# Patient Record
Sex: Male | Born: 1965
Health system: Southern US, Community
[De-identification: ages and names within clinical notes are randomized; demographics above are authoritative.]

## PROBLEM LIST (undated history)

## (undated) DIAGNOSIS — E785 Hyperlipidemia, unspecified: Secondary | ICD-10-CM

## (undated) DIAGNOSIS — I10 Essential (primary) hypertension: Secondary | ICD-10-CM

## (undated) DIAGNOSIS — Z87442 Personal history of urinary calculi: Secondary | ICD-10-CM

## (undated) DIAGNOSIS — Z91199 Patient's noncompliance with other medical treatment and regimen due to unspecified reason: Secondary | ICD-10-CM

## (undated) DIAGNOSIS — E119 Type 2 diabetes mellitus without complications: Secondary | ICD-10-CM

## (undated) DIAGNOSIS — E291 Testicular hypofunction: Secondary | ICD-10-CM

## (undated) DIAGNOSIS — N529 Male erectile dysfunction, unspecified: Secondary | ICD-10-CM

## (undated) DIAGNOSIS — C801 Malignant (primary) neoplasm, unspecified: Secondary | ICD-10-CM

## (undated) HISTORY — DX: Hyperlipidemia, unspecified: E78.5

## (undated) HISTORY — DX: Essential (primary) hypertension: I10

## (undated) HISTORY — DX: Male erectile dysfunction, unspecified: N52.9

## (undated) HISTORY — DX: Testicular hypofunction: E29.1

## (undated) HISTORY — DX: Type 2 diabetes mellitus without complications: E11.9

---

## 2005-02-05 ENCOUNTER — Ambulatory Visit: Payer: Self-pay | Admitting: Internal Medicine

## 2007-05-28 ENCOUNTER — Ambulatory Visit: Payer: Self-pay | Admitting: Internal Medicine

## 2007-06-24 ENCOUNTER — Emergency Department (HOSPITAL_COMMUNITY): Admission: EM | Admit: 2007-06-24 | Discharge: 2007-06-24 | Payer: Self-pay | Admitting: Emergency Medicine

## 2007-07-21 ENCOUNTER — Emergency Department (HOSPITAL_COMMUNITY): Admission: EM | Admit: 2007-07-21 | Discharge: 2007-07-21 | Payer: Self-pay | Admitting: *Deleted

## 2007-11-18 ENCOUNTER — Emergency Department (HOSPITAL_COMMUNITY): Admission: EM | Admit: 2007-11-18 | Discharge: 2007-11-18 | Payer: Self-pay | Admitting: Family Medicine

## 2008-07-21 ENCOUNTER — Emergency Department (HOSPITAL_COMMUNITY): Admission: EM | Admit: 2008-07-21 | Discharge: 2008-07-21 | Payer: Self-pay | Admitting: Emergency Medicine

## 2008-11-26 LAB — HM HEPATITIS C SCREENING LAB: HM Hepatitis Screen: NEGATIVE

## 2008-12-30 ENCOUNTER — Ambulatory Visit: Payer: Self-pay | Admitting: Internal Medicine

## 2008-12-30 DIAGNOSIS — F528 Other sexual dysfunction not due to a substance or known physiological condition: Secondary | ICD-10-CM | POA: Insufficient documentation

## 2008-12-30 DIAGNOSIS — I10 Essential (primary) hypertension: Secondary | ICD-10-CM | POA: Insufficient documentation

## 2009-01-10 ENCOUNTER — Ambulatory Visit: Payer: Self-pay | Admitting: Internal Medicine

## 2009-01-10 DIAGNOSIS — E291 Testicular hypofunction: Secondary | ICD-10-CM | POA: Insufficient documentation

## 2009-01-10 LAB — CONVERTED CEMR LAB
ALT: 30 units/L (ref 0–53)
Albumin: 4.4 g/dL (ref 3.5–5.2)
Alkaline Phosphatase: 24 units/L — ABNORMAL LOW (ref 39–117)
BUN: 11 mg/dL (ref 6–23)
Bilirubin, Direct: 0.1 mg/dL (ref 0.0–0.3)
CO2: 28 meq/L (ref 19–32)
Calcium: 9.8 mg/dL (ref 8.4–10.5)
GFR calc Af Amer: 95 mL/min
Glucose, Bld: 94 mg/dL (ref 70–99)
HDL: 46.7 mg/dL (ref >39.0)
Potassium: 3.7 meq/L (ref 3.5–5.1)
Sodium: 139 meq/L (ref 135–145)
Total Protein: 7.5 g/dL (ref 6.0–8.3)
Triglycerides: 250 mg/dL (ref 0–149)

## 2009-04-05 ENCOUNTER — Telehealth: Payer: Self-pay | Admitting: Internal Medicine

## 2009-04-06 ENCOUNTER — Ambulatory Visit: Payer: Self-pay | Admitting: Internal Medicine

## 2009-04-06 DIAGNOSIS — J309 Allergic rhinitis, unspecified: Secondary | ICD-10-CM | POA: Insufficient documentation

## 2009-04-06 LAB — CONVERTED CEMR LAB
Albumin: 4.3 g/dL (ref 3.5–5.2)
Alkaline Phosphatase: 34 units/L — ABNORMAL LOW (ref 39–117)
Basophils Absolute: 0.1 10*3/uL (ref 0.0–0.1)
Bilirubin, Direct: 0.1 mg/dL (ref 0.0–0.3)
CO2: 28 meq/L (ref 19–32)
Calcium: 9.1 mg/dL (ref 8.4–10.5)
Creatinine, Ser: 1.1 mg/dL (ref 0.4–1.5)
Eosinophils Absolute: 0.1 10*3/uL (ref 0.0–0.7)
GFR calc non Af Amer: 94.99 mL/min (ref >60)
Glucose, Bld: 93 mg/dL (ref 70–99)
Lymphocytes Relative: 20.8 % (ref 12.0–46.0)
MCHC: 35 g/dL (ref 30.0–36.0)
Monocytes Relative: 8.2 % (ref 3.0–12.0)
Neutro Abs: 5.1 10*3/uL (ref 1.4–7.7)
Neutrophils Relative %: 68.9 % (ref 43.0–77.0)
Platelets: 207 10*3/uL (ref 150.0–400.0)
RDW: 12.5 % (ref 11.5–14.6)
Total Bilirubin: 1.1 mg/dL (ref 0.3–1.2)

## 2009-04-13 ENCOUNTER — Encounter: Admission: RE | Admit: 2009-04-13 | Discharge: 2009-04-13 | Payer: Self-pay | Admitting: Internal Medicine

## 2009-04-15 ENCOUNTER — Emergency Department (HOSPITAL_COMMUNITY): Admission: EM | Admit: 2009-04-15 | Discharge: 2009-04-15 | Payer: Self-pay | Admitting: Emergency Medicine

## 2010-01-30 ENCOUNTER — Telehealth (INDEPENDENT_AMBULATORY_CARE_PROVIDER_SITE_OTHER): Payer: Self-pay | Admitting: *Deleted

## 2010-12-26 NOTE — Progress Notes (Signed)
Summary: Records request from [facility]  Request for records received from [facility]. Request forwarded to [employer]. Dena Chavis  January 30, 2010 4:17 PM

## 2010-12-27 ENCOUNTER — Other Ambulatory Visit (HOSPITAL_COMMUNITY): Payer: Self-pay | Admitting: Chiropractic Medicine

## 2010-12-27 DIAGNOSIS — M5410 Radiculopathy, site unspecified: Secondary | ICD-10-CM

## 2011-01-03 ENCOUNTER — Ambulatory Visit (HOSPITAL_COMMUNITY)
Admission: RE | Admit: 2011-01-03 | Discharge: 2011-01-03 | Disposition: A | Discharge status: Home / Self Care | Payer: BC Managed Care – PPO | Source: Ambulatory Visit | Attending: Chiropractic Medicine | Admitting: Chiropractic Medicine

## 2011-01-03 DIAGNOSIS — M542 Cervicalgia: Secondary | ICD-10-CM | POA: Insufficient documentation

## 2011-01-03 DIAGNOSIS — M5020 Other cervical disc displacement, unspecified cervical region: Secondary | ICD-10-CM | POA: Insufficient documentation

## 2011-01-03 DIAGNOSIS — M5090 Cervical disc disorder, unspecified, unspecified cervical region: Secondary | ICD-10-CM | POA: Insufficient documentation

## 2011-01-03 DIAGNOSIS — M79609 Pain in unspecified limb: Secondary | ICD-10-CM | POA: Insufficient documentation

## 2011-01-03 DIAGNOSIS — M25519 Pain in unspecified shoulder: Secondary | ICD-10-CM | POA: Insufficient documentation

## 2011-01-03 DIAGNOSIS — M5410 Radiculopathy, site unspecified: Secondary | ICD-10-CM

## 2011-01-23 ENCOUNTER — Other Ambulatory Visit: Payer: Self-pay | Admitting: Internal Medicine

## 2011-01-23 ENCOUNTER — Telehealth: Payer: Self-pay | Admitting: Internal Medicine

## 2011-01-23 ENCOUNTER — Other Ambulatory Visit: Payer: BC Managed Care – PPO

## 2011-01-23 ENCOUNTER — Encounter (INDEPENDENT_AMBULATORY_CARE_PROVIDER_SITE_OTHER): Payer: Self-pay | Admitting: *Deleted

## 2011-01-23 DIAGNOSIS — Z0389 Encounter for observation for other suspected diseases and conditions ruled out: Secondary | ICD-10-CM

## 2011-01-23 DIAGNOSIS — E785 Hyperlipidemia, unspecified: Secondary | ICD-10-CM

## 2011-01-23 DIAGNOSIS — Z0000 Encounter for general adult medical examination without abnormal findings: Secondary | ICD-10-CM

## 2011-01-23 LAB — BASIC METABOLIC PANEL
Chloride: 101 mEq/L (ref 96–112)
GFR: 71.29 mL/min (ref >60.00)
Glucose, Bld: 88 mg/dL (ref 70–99)
Potassium: 4.3 mEq/L (ref 3.5–5.1)
Sodium: 140 mEq/L (ref 135–145)

## 2011-01-23 LAB — CBC WITH DIFFERENTIAL/PLATELET
Basophils Absolute: 0 10*3/uL (ref 0.0–0.1)
Eosinophils Relative: 0.6 % (ref 0.0–5.0)
HCT: 48.9 % (ref 39.0–52.0)
Hemoglobin: 16.8 g/dL (ref 13.0–17.0)
Lymphs Abs: 1.6 10*3/uL (ref 0.7–4.0)
MCV: 83 fl (ref 78.0–100.0)
Monocytes Relative: 6.6 % (ref 3.0–12.0)
Neutro Abs: 6.8 10*3/uL (ref 1.4–7.7)
RDW: 13.6 % (ref 11.5–14.6)

## 2011-01-23 LAB — HEPATIC FUNCTION PANEL
ALT: 46 U/L (ref 0–53)
AST: 27 U/L (ref 0–37)
Albumin: 4.7 g/dL (ref 3.5–5.2)

## 2011-01-23 LAB — URINALYSIS
Specific Gravity, Urine: 1.02 (ref 1.000–1.030)
Total Protein, Urine: NEGATIVE
Urine Glucose: NEGATIVE

## 2011-01-23 LAB — TSH: TSH: 1.62 u[IU]/mL (ref 0.35–5.50)

## 2011-01-23 LAB — LDL CHOLESTEROL, DIRECT: Direct LDL: 122.1 mg/dL

## 2011-01-23 LAB — PSA: PSA: 0.66 ng/mL (ref 0.10–4.00)

## 2011-01-24 ENCOUNTER — Other Ambulatory Visit: Payer: Self-pay | Admitting: Internal Medicine

## 2011-01-24 ENCOUNTER — Encounter: Payer: Self-pay | Admitting: Internal Medicine

## 2011-01-24 ENCOUNTER — Ambulatory Visit (INDEPENDENT_AMBULATORY_CARE_PROVIDER_SITE_OTHER): Payer: BC Managed Care – PPO | Admitting: Internal Medicine

## 2011-01-24 ENCOUNTER — Ambulatory Visit (INDEPENDENT_AMBULATORY_CARE_PROVIDER_SITE_OTHER)
Admission: RE | Admit: 2011-01-24 | Discharge: 2011-01-24 | Disposition: A | Discharge status: Home / Self Care | Payer: BC Managed Care – PPO | Source: Ambulatory Visit | Attending: Internal Medicine | Admitting: Internal Medicine

## 2011-01-24 DIAGNOSIS — I10 Essential (primary) hypertension: Secondary | ICD-10-CM

## 2011-02-01 NOTE — Progress Notes (Signed)
Summary: ELEVATED BP   Phone Note Call from Patient   Summary of Call: Pt called c/o  elevated bp x 1 wk. Has had slight cp, blurred vision and fatigue off and on. BP today 153/101, reading yesterday 163/107. All readings for the last week have been >140/90. So SOB or severe symptoms. Pt will call office w/any change in symptoms. Pt will have labs today and scheduled for BP eval tomorrow.  Initial call taken by: Lamar Sprinkles, CMA,  January 23, 2011 9:38 AM
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
## 2011-02-21 ENCOUNTER — Other Ambulatory Visit (HOSPITAL_COMMUNITY): Payer: Self-pay | Admitting: Chiropractic Medicine

## 2011-02-21 DIAGNOSIS — M545 Low back pain, unspecified: Secondary | ICD-10-CM

## 2011-02-23 ENCOUNTER — Ambulatory Visit (INDEPENDENT_AMBULATORY_CARE_PROVIDER_SITE_OTHER): Payer: BC Managed Care – PPO | Admitting: Internal Medicine

## 2011-02-23 VITALS — BP 142/110 | HR 61 | Temp 98.3°F | Wt 261.0 lb

## 2011-02-23 DIAGNOSIS — I10 Essential (primary) hypertension: Secondary | ICD-10-CM

## 2011-02-24 ENCOUNTER — Ambulatory Visit (HOSPITAL_COMMUNITY)
Admission: RE | Admit: 2011-02-24 | Discharge: 2011-02-24 | Disposition: A | Discharge status: Home / Self Care | Payer: BC Managed Care – PPO | Source: Ambulatory Visit | Attending: Chiropractic Medicine | Admitting: Chiropractic Medicine

## 2011-02-24 DIAGNOSIS — M545 Low back pain, unspecified: Secondary | ICD-10-CM | POA: Insufficient documentation

## 2011-02-24 DIAGNOSIS — M47817 Spondylosis without myelopathy or radiculopathy, lumbosacral region: Secondary | ICD-10-CM | POA: Insufficient documentation

## 2011-02-24 DIAGNOSIS — M51379 Other intervertebral disc degeneration, lumbosacral region without mention of lumbar back pain or lower extremity pain: Secondary | ICD-10-CM | POA: Insufficient documentation

## 2011-02-24 DIAGNOSIS — M5137 Other intervertebral disc degeneration, lumbosacral region: Secondary | ICD-10-CM | POA: Insufficient documentation

## 2011-02-25 ENCOUNTER — Encounter: Payer: Self-pay | Admitting: Internal Medicine

## 2011-02-25 NOTE — Progress Notes (Signed)
  Subjective:    Patient ID: Cameron Mcneil, male    DOB: July 14, 1966, 45 y.o.   MRN: [id number]  HPI Patient presents for follow-uup. He was last seen Feb 29th for hypertension and headache. He has been seen for headache in the past by Dr. Lesia Hausen prior to my seeing him and it is thought to be related to  Hypertension. Since his last visit he has not been taking his medications as prescribed and his blood pressure continues to run high. Aside from headache he has not had any other symptoms: blurred vision, chest pain, SOB. He describes the headache as sharp stabbing pain at the vertex skull and forward.     Review of Systems Review of Systems  Constitutional:  Negative for fever, chills, activity change and unexpected weight change. Positive for headache.  HENT:  Negative for hearing loss, ear pain, congestion, neck stiffness and postnasal drip.   Eyes: Negative for pain, discharge and visual disturbance.  Respiratory: Negative for chest tightness and wheezing.   Cardiovascular: Negative for chest pain and palpitations.       [No decreased exercise tolerance Gastrointestinal: [No change in bowel habit. No bloating or gas. No reflux or indigestion Genitourinary: Negative for urgency, frequency, flank pain and difficulty urinating.  Musculoskeletal: Negative for myalgias, back pain, arthralgias and gait problem.  Neurological: Negative for dizziness, tremors, weakness and headaches.  Hematological: Negative for adenopathy.  Psychiatric/Behavioral: Negative for behavioral problems and dysphoric mood.       Objective:   Physical Exam  Vitals reviewed. Constitutional: He is oriented to person, place, and time. He appears well-developed and well-nourished. No distress.  HENT:  Head: Normocephalic and atraumatic.  Eyes: Conjunctivae and EOM are normal. Pupils are equal, round, and reactive to light.  Cardiovascular: Normal rate and regular rhythm.   Pulmonary/Chest: Effort normal.    Neurological: He is alert and oriented to person, place, and time. He has normal reflexes. No cranial nerve deficit.  Psychiatric: He has a normal mood and affect. His behavior is normal.          Assessment & Plan:  1. Hypertension - patient with uncontrolled blood pressure. Heis not taking his medication as prescribed.  Plan - patient to take medication as prescribed: losartan/Hct 50/12.5 once a day.           Return for BP check/nurse visit soon.

## 2011-03-06 LAB — GC/CHLAMYDIA PROBE AMP, GENITAL
Chlamydia, DNA Probe: POSITIVE — AB
GC Probe Amp, Genital: NEGATIVE

## 2011-04-09 ENCOUNTER — Inpatient Hospital Stay (INDEPENDENT_AMBULATORY_CARE_PROVIDER_SITE_OTHER)
Admission: RE | Admit: 2011-04-09 | Discharge: 2011-04-09 | Disposition: A | Discharge status: Home / Self Care | Payer: BC Managed Care – PPO | Source: Ambulatory Visit | Attending: Family Medicine | Admitting: Family Medicine

## 2011-04-09 ENCOUNTER — Ambulatory Visit: Payer: BC Managed Care – PPO | Admitting: Endocrinology

## 2011-04-09 DIAGNOSIS — H1045 Other chronic allergic conjunctivitis: Secondary | ICD-10-CM

## 2011-04-09 DIAGNOSIS — B354 Tinea corporis: Secondary | ICD-10-CM

## 2011-04-12 ENCOUNTER — Ambulatory Visit (INDEPENDENT_AMBULATORY_CARE_PROVIDER_SITE_OTHER): Payer: BC Managed Care – PPO | Admitting: Internal Medicine

## 2011-04-12 ENCOUNTER — Encounter: Payer: Self-pay | Admitting: Internal Medicine

## 2011-04-12 DIAGNOSIS — J019 Acute sinusitis, unspecified: Secondary | ICD-10-CM | POA: Insufficient documentation

## 2011-04-12 DIAGNOSIS — H1130 Conjunctival hemorrhage, unspecified eye: Secondary | ICD-10-CM

## 2011-04-12 DIAGNOSIS — I10 Essential (primary) hypertension: Secondary | ICD-10-CM

## 2011-04-12 DIAGNOSIS — J309 Allergic rhinitis, unspecified: Secondary | ICD-10-CM

## 2011-04-12 MED ORDER — AZITHROMYCIN 250 MG PO TABS: ORAL_TABLET | ORAL | Status: AC

## 2011-04-12 MED ORDER — LOSARTAN POTASSIUM 50 MG PO TABS: 50.0000 mg | ORAL_TABLET | Freq: Every day | ORAL | Status: DC

## 2011-04-12 MED ORDER — FLUTICASONE PROPIONATE 50 MCG/ACT NA SUSP: 2.0000 | Freq: Every day | NASAL | Status: DC

## 2011-04-12 NOTE — Progress Notes (Signed)
  Subjective:    Patient ID: Cameron Mcneil, male    DOB: 06/25/1966, 45 y.o.   MRN: [id number]  HPI   Here with 3 days acute onset fever, facial pain, pressure, general weakness and malaise, and greenish d/c, with slight ST, but little to no cough and Pt denies chest pain, increased sob or doe, wheezing, orthopnea, PND, increased LE swelling, palpitations, dizziness or syncope.  Pt denies new neurological symptoms such as new headache, or facial or extremity weakness or numbness   Pt denies polydipsia, polyuria  Pt states overall good compliance with meds, trying to follow lower cholesterol  diet, wt overall down, then back up again since he broke a toe and less active.  Does have several wks ongoing nasal allergy symptoms with clear congestion, itch and sneeze, without fever, pain, ST, cough or wheezing..  BP has been persistently more elev recently, such as at the dentist.   Past Medical History  Diagnosis Date  . Allergic rhinitis   . Hypogonadism male   . ED (erectile dysfunction)    No past surgical history on file.  reports that he has never smoked. He does not have any smokeless tobacco history on file. His alcohol and drug histories not on file. family history includes Diabetes in his other and Hypertension in his mother.  There is no history of Prostate cancer and Colon cancer. No Known Allergies Current Outpatient Prescriptions on File Prior to Visit  Medication Sig Dispense Refill  . dexlansoprazole (DEXILANT) 60 MG capsule Take 60 mg by mouth daily. For 4 weeks       . testosterone (ANDRODERM) 5 MG/24HR Place 1 patch onto the skin daily.        Marland Kitchen DISCONTD: losartan-hydrochlorothiazide (HYZAAR) 50-12.5 MG per tablet Take 1 tablet by mouth daily.         Review of Systems All otherwise neg per pt     Objective:   Physical Exam BP 148/104  Pulse 75  Temp(Src) 98.5 F (36.9 C) (Oral)  Ht 6\' 2"  (1.88 m)  Wt 263 lb 6 oz (119.466 kg)  BMI 33.82 kg/m2  SpO2 97% Physical Exam   VS noted Constitutional: Pt appears well-developed and well-nourished.  HENT: Head: Normocephalic.  Right Ear: External ear normal.  Left Ear: External ear normal.   Bilat tm's mild erythema.  Sinus tender.  Pharynx mild erythema. Small left subconjucntival hemorrhage noted Eyes: Conjunctivae and EOM are normal. Pupils are equal, round, and reactive to light.  Neck: Normal range of motion. Neck supple.  Cardiovascular: Normal rate and regular rhythm.   Pulmonary/Chest: Effort normal and breath sounds normal.  Neurological: Pt is alert. No cranial nerve deficit.  Skin: Skin is warm. No erythema.  Psychiatric: Pt behavior is normal. Thought content normal.         Assessment & Plan:

## 2011-04-12 NOTE — Assessment & Plan Note (Signed)
Small left sided, possbily related to BP elevation, but more likely due to rubbing the eyes recently

## 2011-04-12 NOTE — Assessment & Plan Note (Signed)
To add the flonase as he has done well in the past,  to f/u any worsening symptoms or concerns

## 2011-04-12 NOTE — Assessment & Plan Note (Signed)
Start losartan 50 mg, avoid the HCTZ as he seemed to think that part caused side effect; f/u with BP at home and next visit

## 2011-04-12 NOTE — Patient Instructions (Signed)
Take all new medications as prescribed - the antibiotic, flonase, and losartan You can also take Delsym OTC for cough, and/or Mucinex (or it's generic off brand) for congestion Please return in 2 months to Dr Debby Bud

## 2011-04-12 NOTE — Assessment & Plan Note (Signed)
Mild to mod, for antibx tx,  to f/u any worsening symptoms or concerns

## 2011-04-13 ENCOUNTER — Ambulatory Visit: Payer: BC Managed Care – PPO | Admitting: Internal Medicine

## 2011-05-10 ENCOUNTER — Other Ambulatory Visit (INDEPENDENT_AMBULATORY_CARE_PROVIDER_SITE_OTHER): Payer: BC Managed Care – PPO

## 2011-05-10 ENCOUNTER — Encounter: Payer: Self-pay | Admitting: Internal Medicine

## 2011-05-10 ENCOUNTER — Ambulatory Visit (INDEPENDENT_AMBULATORY_CARE_PROVIDER_SITE_OTHER): Payer: BC Managed Care – PPO | Admitting: Internal Medicine

## 2011-05-10 DIAGNOSIS — R109 Unspecified abdominal pain: Secondary | ICD-10-CM

## 2011-05-10 DIAGNOSIS — R82998 Other abnormal findings in urine: Secondary | ICD-10-CM

## 2011-05-10 DIAGNOSIS — R8290 Unspecified abnormal findings in urine: Secondary | ICD-10-CM

## 2011-05-10 LAB — CBC WITH DIFFERENTIAL/PLATELET
Basophils Relative: 0.1 % (ref 0.0–3.0)
Eosinophils Absolute: 0.1 10*3/uL (ref 0.0–0.7)
MCHC: 34.1 g/dL (ref 30.0–36.0)
MCV: 84.6 fl (ref 78.0–100.0)
Monocytes Absolute: 0.7 10*3/uL (ref 0.1–1.0)
Neutrophils Relative %: 73.5 % (ref 43.0–77.0)
RBC: 5.68 Mil/uL (ref 4.22–5.81)

## 2011-05-10 LAB — COMPREHENSIVE METABOLIC PANEL
AST: 32 U/L (ref 0–37)
Albumin: 4.9 g/dL (ref 3.5–5.2)
Alkaline Phosphatase: 36 U/L — ABNORMAL LOW (ref 39–117)
BUN: 13 mg/dL (ref 6–23)
Creatinine, Ser: 1.2 mg/dL (ref 0.4–1.5)
Potassium: 3.7 mEq/L (ref 3.5–5.1)
Total Bilirubin: 0.6 mg/dL (ref 0.3–1.2)

## 2011-05-10 LAB — URINALYSIS, ROUTINE W REFLEX MICROSCOPIC
Bilirubin Urine: NEGATIVE
Leukocytes, UA: NEGATIVE
Nitrite: NEGATIVE

## 2011-05-10 NOTE — Progress Notes (Signed)
  Subjective:    Patient ID: BYRANT VALENT, male    DOB: Apr 23, 1966, 45 y.o.   MRN: [id number]  HPI Mr. Buenaventura presents for a problem of left flank pain, enough to wake him up from sleep, and he has had visible sediment in the urine. He did have darker, orange urine. He has tried magnesium, lemon juice and oil with no relief. He has had the pain for several days. He has had no fever, chills. He has had some nausea. The pain is better at today's visit then it has been.  Past Medical History  Diagnosis Date  . Allergic rhinitis   . Hypogonadism male   . ED (erectile dysfunction)    No past surgical history on file. Family History  Problem Relation Age of Onset  . Hypertension Mother   . Prostate cancer Neg Hx   . Colon cancer Neg Hx   . Diabetes Other    History   Social History  . Marital Status: Single    Spouse Name: N/A    Number of Children: N/A  . Years of Education: N/A   Occupational History  . Owner/operator; [employer] service [employer]    Social History Main Topics  . Smoking status: Never Smoker   . Smokeless tobacco: Not on file  . Alcohol Use: Not on file  . Drug Use: Not on file  . Sexually Active: Not on file   Other Topics Concern  . Not on file   Social History Narrative   [employer] Graduate       Review of Systems Review of Systems  Constitutional:  Negative for fever, chills, activity change and unexpected weight change.  HEENT:  Negative for hearing loss, ear pain, congestion, neck stiffness and postnasal drip. Negative for sore throat or swallowing problems. Negative for dental complaints.   Eyes: Negative for vision loss or change in visual acuity.  Respiratory: Negative for chest tightness and wheezing.   Cardiovascular: Negative for chest pain and palpitationNo decreased exercise tolerance Gastrointestinal: No change in bowel habit. No bloating or gas. No reflux or indigestion Genitourinary: Negative for urgency, frequency, flank  pain and difficulty urinating.  Musculoskeletal: Negative for myalgias, back pain, arthralgias and gait problem.  Neurological: Negative for dizziness, tremors, weakness and headaches.  Hematological: Negative for adenopathy.  Psychiatric/Behavioral: Negative for behavioral problems and dysphoric mood.       Objective:   Physical Exam Vitals noted Gen'l - WNWD Aa male in no distress Back - no flank tenderness to percussion. Abd - positive BS x 4, no guarding or rebound, mild tenderness to deep palpation LLQ       Assessment & Plan:  1. Abnormal urine sediment with flank pain - may be nephrolithtiasis.  Plan - U/A, CBC, Bmet           If labs positive will set up CT abd/pelvis kidney stone protocol.  Addendum - labs normal: no leukocytosis, renal function normal, u/a without microscopic hematuria. No abnormal sediment reported  Plan - no indication for Ct stone protocol           For continued pain - urology referral.

## 2011-05-14 ENCOUNTER — Telehealth: Payer: Self-pay | Admitting: Internal Medicine

## 2011-05-14 DIAGNOSIS — R8290 Unspecified abnormal findings in urine: Secondary | ICD-10-CM

## 2011-05-14 NOTE — Telephone Encounter (Signed)
Please call patient: labs ok and no blood in urine. If he is still having flank pain will need to refer to urology  Thanks

## 2011-05-14 NOTE — Telephone Encounter (Signed)
refereral order placed

## 2011-05-14 NOTE — Telephone Encounter (Signed)
Pt is still having flank pain. He would like a ref. To a urologist

## 2011-06-15 ENCOUNTER — Other Ambulatory Visit: Payer: Self-pay | Admitting: Podiatry

## 2011-06-15 DIAGNOSIS — M79671 Pain in right foot: Secondary | ICD-10-CM

## 2011-06-19 ENCOUNTER — Ambulatory Visit
Admission: RE | Admit: 2011-06-19 | Discharge: 2011-06-19 | Disposition: A | Discharge status: Home / Self Care | Payer: BC Managed Care – PPO | Source: Ambulatory Visit | Attending: Podiatry | Admitting: Podiatry

## 2011-06-19 DIAGNOSIS — M79671 Pain in right foot: Secondary | ICD-10-CM

## 2011-11-28 ENCOUNTER — Ambulatory Visit: Payer: BC Managed Care – PPO | Admitting: Internal Medicine

## 2012-02-28 ENCOUNTER — Ambulatory Visit: Payer: BC Managed Care – PPO | Admitting: Internal Medicine

## 2012-03-04 ENCOUNTER — Ambulatory Visit: Payer: BC Managed Care – PPO | Admitting: Internal Medicine

## 2012-03-04 DIAGNOSIS — Z0289 Encounter for other administrative examinations: Secondary | ICD-10-CM

## 2012-04-20 ENCOUNTER — Other Ambulatory Visit: Payer: Self-pay | Admitting: Internal Medicine

## 2012-08-26 ENCOUNTER — Encounter (HOSPITAL_COMMUNITY): Payer: Self-pay | Admitting: *Deleted

## 2012-08-26 ENCOUNTER — Emergency Department (HOSPITAL_COMMUNITY)
Admission: EM | Admit: 2012-08-26 | Discharge: 2012-08-26 | Disposition: A | Discharge status: Home / Self Care | Payer: BC Managed Care – PPO | Source: Home / Self Care | Attending: Emergency Medicine | Admitting: Emergency Medicine

## 2012-08-26 DIAGNOSIS — T700XXA Otitic barotrauma, initial encounter: Secondary | ICD-10-CM

## 2012-08-26 MED ORDER — FEXOFENADINE-PSEUDOEPHED ER 60-120 MG PO TB12: 1.0000 | ORAL_TABLET | Freq: Two times a day (BID) | ORAL | Status: AC

## 2012-08-26 NOTE — ED Provider Notes (Addendum)
History     CSN: [id number]  Arrival date & time 08/26/12  4098   First MD Initiated Contact with Patient 08/26/12 [phone]      Chief Complaint  Patient presents with  . Otalgia    (Consider location/radiation/quality/duration/timing/severity/associated sxs/prior treatment) HPI Comments: Patient presents urgent care this morning complaining of an ongoing sensation of pressure coming from both of these ears. Patient traveled to lose weight is almost 2 weeks ago and have been noticing this discomfort for about a week. Since his return. Patient denies any nausea, tinnitus, ear drainage, or trauma. Does describe that prior to his trip he had some sinus congestion which he thought was allergy related. Patient denies any ear trauma, decreased hearing although does feel that his ear is occupied somehow. Denies any headaches, or further symptoms such as vertigo, dizziness, chest pains, shortness of breath or any type of paresthesias to his upper lower extremities when asked.  Patient is a 46 y.o. male presenting with ear pain. The history is provided by the patient.  Otalgia This is a new problem. The current episode started more than 2 days ago. There is pain in both ears. The problem occurs constantly. The problem has not changed since onset.There has been no fever. Pertinent negatives include no ear discharge, no headaches, no hearing loss, no rhinorrhea, no abdominal pain, no vomiting, no neck pain, no cough and no rash. His past medical history does not include hearing loss or tympanostomy tube.    Past Medical History  Diagnosis Date  . Allergic rhinitis   . Hypogonadism male   . ED (erectile dysfunction)     History reviewed. No pertinent past surgical history.  Family History  Problem Relation Age of Onset  . Hypertension Mother   . Prostate cancer Neg Hx   . Colon cancer Neg Hx   . Diabetes Other     History  Substance Use Topics  . Smoking status: Never Smoker   . Smokeless  tobacco: Not on file  . Alcohol Use: No      Review of Systems  Constitutional: Negative for chills, diaphoresis, activity change, appetite change and fatigue.  HENT: Positive for ear pain. Negative for hearing loss, congestion, rhinorrhea, neck pain, neck stiffness and ear discharge.   Eyes: Negative for pain.  Respiratory: Negative for cough and shortness of breath.   Cardiovascular: Negative for chest pain.  Gastrointestinal: Negative for vomiting and abdominal pain.  Skin: Negative for rash.  Neurological: Negative for dizziness, weakness, numbness and headaches.    Allergies  Review of patient's allergies indicates no known allergies.  Home Medications   Current Outpatient Rx  Name Route Sig Dispense Refill  . DEXLANSOPRAZOLE 60 MG PO CPDR Oral Take 60 mg by mouth daily. For 4 weeks     . FEXOFENADINE-PSEUDOEPHED ER 60-120 MG PO TB12 Oral Take 1 tablet by mouth every 12 (twelve) hours. 30 tablet 0  . FLUTICASONE PROPIONATE 50 MCG/ACT NA SUSP Nasal 2 sprays by Nasal route daily. 16 g 2  . LOSARTAN POTASSIUM 50 MG PO TABS  TAKE ONE TABLET BY MOUTH DAILY 30 tablet 1  . TESTOSTERONE 5 MG/24HR TD PT24 Transdermal Place 1 patch onto the skin daily.        BP 165/116  Pulse 116  Temp 98.4 F (36.9 C) (Oral)  Resp 18  SpO2 98%  Physical Exam  Nursing note and vitals reviewed. Constitutional: He is oriented to person, place, and time. He appears well-developed and well-nourished.  HENT:  Right Ear: Hearing, tympanic membrane, external ear and ear canal normal.  Left Ear: Hearing, tympanic membrane and external ear normal.  Eyes: Conjunctivae normal are normal.  Neck: Neck supple. No JVD present. No thyromegaly present.  Lymphadenopathy:    He has no cervical adenopathy.  Neurological: He is alert and oriented to person, place, and time. He displays normal reflexes. No cranial nerve deficit. He exhibits normal muscle tone. Coordination normal.  Skin: Skin is warm. No rash  noted. No erythema.    ED Course  Procedures (including critical care time)  Labs Reviewed - No data to display No results found.   1. Barotrauma, otic       MDM  Patient with bilateral otalgia most likely related to barotrauma, no abnormalities were noted on this tympanic membrane her ear canal to suggest effusions, bleeding perforation retraction of his eardrum. Patient also without any vestibular symptomatology. Was prescribed Allegra-D as patient later described that he had some upper congestion prior history to [facility]. Been encouraged to followup with the ear specialist if symptoms persist or worsens after 5-7 days. Patient agree with treatment plan and followup care as necessary.   Patient noted hypertensive encouraged him to recheck his blood pressure in the near future with his primary care Dr.    Jimmie Molly, MD 08/26/12 1448  Jimmie Molly, MD 08/26/12 [phone]

## 2012-08-26 NOTE — ED Notes (Signed)
Pt report returning from recent trip to [facility] with bilateral ear pain that has not gotten better over the past week

## 2012-10-11 ENCOUNTER — Other Ambulatory Visit: Payer: Self-pay | Admitting: Internal Medicine

## 2013-01-21 ENCOUNTER — Other Ambulatory Visit: Payer: Self-pay | Admitting: Internal Medicine

## 2013-01-23 ENCOUNTER — Other Ambulatory Visit: Payer: Self-pay | Admitting: Internal Medicine

## 2013-02-12 ENCOUNTER — Ambulatory Visit: Payer: BC Managed Care – PPO | Admitting: Internal Medicine

## 2013-02-19 ENCOUNTER — Encounter: Payer: Self-pay | Admitting: Internal Medicine

## 2013-02-19 ENCOUNTER — Ambulatory Visit (INDEPENDENT_AMBULATORY_CARE_PROVIDER_SITE_OTHER): Payer: BC Managed Care – PPO | Admitting: Internal Medicine

## 2013-02-19 ENCOUNTER — Other Ambulatory Visit (INDEPENDENT_AMBULATORY_CARE_PROVIDER_SITE_OTHER): Payer: BC Managed Care – PPO

## 2013-02-19 VITALS — BP 158/100 | HR 78 | Temp 97.7°F | Resp 12 | Ht 74.0 in | Wt 258.0 lb

## 2013-02-19 DIAGNOSIS — R209 Unspecified disturbances of skin sensation: Secondary | ICD-10-CM

## 2013-02-19 DIAGNOSIS — Z0000 Encounter for general adult medical examination without abnormal findings: Secondary | ICD-10-CM

## 2013-02-19 DIAGNOSIS — R202 Paresthesia of skin: Secondary | ICD-10-CM | POA: Insufficient documentation

## 2013-02-19 DIAGNOSIS — I10 Essential (primary) hypertension: Secondary | ICD-10-CM

## 2013-02-19 DIAGNOSIS — E291 Testicular hypofunction: Secondary | ICD-10-CM

## 2013-02-19 DIAGNOSIS — E669 Obesity, unspecified: Secondary | ICD-10-CM

## 2013-02-19 LAB — COMPREHENSIVE METABOLIC PANEL
ALT: 37 U/L (ref 0–53)
AST: 24 U/L (ref 0–37)
Albumin: 4.8 g/dL (ref 3.5–5.2)
Alkaline Phosphatase: 38 U/L — ABNORMAL LOW (ref 39–117)
Glucose, Bld: 85 mg/dL (ref 70–99)
Potassium: 4.1 mEq/L (ref 3.5–5.1)
Sodium: 137 mEq/L (ref 135–145)
Total Protein: 8.3 g/dL (ref 6.0–8.3)

## 2013-02-19 LAB — LIPID PANEL
HDL: 50.3 mg/dL (ref >39.00)
Total CHOL/HDL Ratio: 4

## 2013-02-19 LAB — VITAMIN B12: Vitamin B-12: 455 pg/mL (ref 211–911)

## 2013-02-19 LAB — TSH: TSH: 2.71 u[IU]/mL (ref 0.35–5.50)

## 2013-02-19 LAB — TESTOSTERONE: Testosterone: 250.75 ng/dL — ABNORMAL LOW (ref 350.00–890.00)

## 2013-02-19 LAB — HEPATIC FUNCTION PANEL
ALT: 37 U/L (ref 0–53)
AST: 24 U/L (ref 0–37)
Albumin: 4.8 g/dL (ref 3.5–5.2)
Total Protein: 8.3 g/dL (ref 6.0–8.3)

## 2013-02-19 LAB — LDL CHOLESTEROL, DIRECT: Direct LDL: 105.4 mg/dL

## 2013-02-19 MED ORDER — LOSARTAN POTASSIUM 50 MG PO TABS: ORAL_TABLET | ORAL | Status: DC

## 2013-02-19 NOTE — Patient Instructions (Addendum)
Good to see you again.  Blood pressure: the goal is SBP 140 or less all the time; DBP less than 90 all the time. Plan Exercise!!!!!!!  Increase losartan to 100 mg daily  Monitor at home and send report by MyChart  Hypogonadism - will recheck testosterone level Plan Recommendations to be based on results  Weight management: Diet management: smart food choices, PORTION SIZE CONTROL, regular exercise. Goal - to loose 1-2 lbs.month. Target weight - 220, should take 18-24 months for durable improvement. Read this book: "Younger Next Year -for men." Sorry - 27 is not coming back. It takes work  Overall health - labs are order: sugar, cholesterol, liver functions. It takes work - EXERCISE!!!!!  Return office visit if we can't all this under control.   Sign up for MyChart

## 2013-02-21 ENCOUNTER — Encounter: Payer: Self-pay | Admitting: Internal Medicine

## 2013-02-21 DIAGNOSIS — E669 Obesity, unspecified: Secondary | ICD-10-CM | POA: Insufficient documentation

## 2013-02-21 DIAGNOSIS — Z0000 Encounter for general adult medical examination without abnormal findings: Secondary | ICD-10-CM | POA: Insufficient documentation

## 2013-02-21 NOTE — Assessment & Plan Note (Signed)
Testosterone is below normal at 250.  Plan Testosterone replacement to be discussed

## 2013-02-21 NOTE — Assessment & Plan Note (Signed)
Interval history notable for poorly controlled hypertension, weight gain and mild symptoms of hypogonadism. Physical exam is normal. Lab result with low testosterone otherwise normal. He is not due for colorectal cancer or prostate cancer screening. He is due to Tdap.  In summary - a nice man who needs to make exercise and good diet a part of his life. He will return to discuss testosterone replacement and for BP check.

## 2013-02-21 NOTE — Assessment & Plan Note (Signed)
Based on weight and height he is in obesity class I. Discussed the importance of controlling weight in regard to overall health and in particular in regard to hypertension.  Plan Weight management: Diet management: smart food choices, PORTION SIZE CONTROL, regular exercise. Goal - to loose 1-2 lbs.month. Target weight - 220 lbs

## 2013-02-21 NOTE — Assessment & Plan Note (Addendum)
BP Readings from Last 3 Encounters:  02/19/13 158/100  08/26/12 165/116  05/10/11 180/110   Poor control, but exam without signs of end-organ damage.  Plan Increase Losartan to 100 mg daily  Exercise with weight loss  Monitor at home, if SBP consistently greater than 140 will need to add second agent

## 2013-02-21 NOTE — Progress Notes (Signed)
Subjective:    Patient ID: Cameron Mcneil, male    DOB: 1965-12-27, 47 y.o.   MRN: [id number]  HPI Cameron Mcneil presents for follow up with last visit in 2010. In the interval he has had good health with no major illness, surgery or injury. He has had some weight gain. He admits to have decreased his exercise. He has concerns about blood pressure levels being elevated. He reports mild decreased tumescence although w/o change in function. He has a h/o hypogonadism but stopped using testosterone replacement. He has a concern for routine health matters, i.e. Blood sugar, cholesterol levels, etc.  Cameron Mcneil reports he is current with his dentist but has not had an eye exam for several years. 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  Past Medical History  Diagnosis Date  . Allergic rhinitis   . Hypogonadism male   . ED (erectile dysfunction)    History reviewed. No pertinent past surgical history. Family History  Problem Relation Age of Onset  . Hypertension Mother   . Prostate cancer Neg Hx   . Colon cancer Neg Hx   . Diabetes Other    History   Social History  . Marital Status: Single    Spouse Name: N/A    Number of Children: N/A  . Years of Education: 16   Occupational History  . 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     Social History Main Topics  . Smoking status: Never Smoker   . Smokeless tobacco: Never Used  . Alcohol Use: Yes  . Drug Use: No  . Sexually Active: Yes -- Male partner(s)   Other Topics Concern  . Not on file   Social History Narrative   [employer], [employer]. Single but in relationship. Work - Cytogeneticist, Pharmacist, hospital.         Review of Systems Constitutional:  Negative for fever, chills, activity change and unexpected weight change.  HEENT:  Negative for hearing loss, ear pain, congestion, neck stiffness and postnasal drip. Negative for sore throat or  swallowing problems. Negative for dental complaints.   Eyes: Negative for vision loss or change in visual acuity.  Respiratory: Negative for chest tightness and wheezing. Negative for DOE.   Cardiovascular: Negative for chest pain or palpitations. No decreased exercise tolerance Gastrointestinal: No change in bowel habit. No bloating or gas. No reflux or indigestion Genitourinary: Negative for urgency, frequency, flank pain and difficulty urinating.  Musculoskeletal: Negative for myalgias, back pain, arthralgias and gait problem.  Neurological: Negative for dizziness, tremors, weakness and headaches.  Hematological: Negative for adenopathy.  Psychiatric/Behavioral: Negative for behavioral problems and dysphoric mood.       Objective:   Physical Exam Filed Vitals:   02/19/13 1315  BP: 158/100  Pulse: 78  Temp: 97.7 F (36.5 C)  Resp: 12   Wt Readings from Last 3 Encounters:  02/19/13 258 lb (117.028 kg)  05/10/11 263 lb (119.296 kg)  04/12/11 263 lb 6 oz (119.466 kg)   Gen'l - athletically built, large man in no distress HEENT - C&S clear, PERRLA, oropharynx with good dentition, no oral lesion Neck- supple, w/o thyromegaly Nodes - negative cervical and supraclavicular region Cor - 2+ radial pulse, RRR, no JVD, no carotid bruits Pulm - normal respirations Abd- normal Ext - no Clubbing, cyanosis or edema. No deformity Neuro - A&O x 3, CN II-XII normal, normal gait and station, no tremor.  Lab Results  Component Value Date   WBC 9.0 05/10/2011   HGB 16.4 05/10/2011   HCT 48.0 05/10/2011   PLT 211.0 05/10/2011   GLUCOSE 85 02/19/2013   CHOL 196 02/19/2013   TRIG 237.0* 02/19/2013   HDL 50.30 02/19/2013   LDLDIRECT 105.4 02/19/2013        ALT 37 02/19/2013   AST 24 02/19/2013        NA 137 02/19/2013   K 4.1 02/19/2013   CL 101 02/19/2013   CREATININE 1.2 02/19/2013   BUN 15 02/19/2013   CO2 26 02/19/2013   TSH 2.71 02/19/2013   PSA 0.66 01/23/2011        B12                         455                                                                 02/19/2013       Testosterone         250.75 (low)                                                   02/19/2013        Assessment & Plan:

## 2013-02-22 ENCOUNTER — Encounter: Payer: Self-pay | Admitting: Internal Medicine

## 2013-06-12 ENCOUNTER — Other Ambulatory Visit: Payer: Self-pay | Admitting: Internal Medicine

## 2013-09-01 ENCOUNTER — Ambulatory Visit: Payer: Self-pay | Admitting: Podiatry

## 2013-09-22 ENCOUNTER — Ambulatory Visit: Payer: Self-pay | Admitting: Podiatry

## 2013-10-01 ENCOUNTER — Other Ambulatory Visit: Payer: Self-pay

## 2013-10-20 ENCOUNTER — Ambulatory Visit: Payer: Self-pay | Admitting: Podiatry

## 2013-10-20 ENCOUNTER — Encounter: Payer: Self-pay | Admitting: Podiatry

## 2013-10-20 ENCOUNTER — Ambulatory Visit (INDEPENDENT_AMBULATORY_CARE_PROVIDER_SITE_OTHER): Payer: BC Managed Care – PPO

## 2013-10-20 ENCOUNTER — Ambulatory Visit (INDEPENDENT_AMBULATORY_CARE_PROVIDER_SITE_OTHER): Payer: BC Managed Care – PPO | Admitting: Podiatry

## 2013-10-20 VITALS — BP 134/84 | HR 76 | Resp 16 | Ht 74.0 in | Wt 250.0 lb

## 2013-10-20 DIAGNOSIS — M2021 Hallux rigidus, right foot: Secondary | ICD-10-CM

## 2013-10-20 DIAGNOSIS — S92001D Unspecified fracture of right calcaneus, subsequent encounter for fracture with routine healing: Secondary | ICD-10-CM

## 2013-10-20 DIAGNOSIS — M2020 Hallux rigidus, unspecified foot: Secondary | ICD-10-CM

## 2013-10-20 DIAGNOSIS — IMO0001 Reserved for inherently not codable concepts without codable children: Secondary | ICD-10-CM

## 2013-10-20 NOTE — Patient Instructions (Signed)
Pre-Operative Instructions  Congratulations, you have decided to take an important step to improving your quality of life.  You can be assured that the doctors of [facility] will be with you every step of the way.  1. Plan to be at the surgery center/hospital at least 1 (one) hour prior to your scheduled time unless otherwise directed by the surgical center/hospital staff.  You must have a responsible adult accompany you, remain during the surgery and drive you home.  Make sure you have directions to the surgical center/hospital and know how to get there on time. 2. For hospital based surgery you will need to obtain a history and physical form from your family physician within 1 month prior to the date of surgery- we will give you a form for you primary physician.  3. We make every effort to accommodate the date you request for surgery.  There are however, times where surgery dates or times have to be moved.  We will contact you as soon as possible if a change in schedule is required.   4. No Aspirin/Ibuprofen for one week before surgery.  If you are on aspirin, any non-steroidal anti-inflammatory medications (Mobic, Aleve, Ibuprofen) you should stop taking it 7 days prior to your surgery.  You make take Tylenol  For pain prior to surgery.  5. Medications- If you are taking daily heart and blood pressure medications, seizure, reflux, allergy, asthma, anxiety, pain or diabetes medications, make sure the surgery center/hospital is aware before the day of surgery so they may notify you which medications to take or avoid the day of surgery. 6. No food or drink after midnight the night before surgery unless directed otherwise by surgical center/hospital staff. 7. No alcoholic beverages 24 hours prior to surgery.  No smoking 24 hours prior to or 24 hours after surgery. 8. Wear loose pants or shorts- loose enough to fit over bandages, boots, and casts. 9. No slip on shoes, sneakers are best. 10. Bring  your boot with you to the surgery center/hospital.  Also bring crutches or a walker if your physician has prescribed it for you.  If you do not have this equipment, it will be provided for you after surgery. 11. If you have not been contracted by the surgery center/hospital by the day before your surgery, call to confirm the date and time of your surgery. 12. Leave-time from work may vary depending on the type of surgery you have.  Appropriate arrangements should be made prior to surgery with your employer. 13. Prescriptions will be provided immediately following surgery by your doctor.  Have these filled as soon as possible after surgery and take the medication as directed. 14. Remove nail polish on the operative foot. 15. Wash the night before surgery.  The night before surgery wash the foot and leg well with the antibacterial soap provided and water paying special attention to beneath the toenails and in between the toes.  Rinse thoroughly with water and dry well with a towel.  Perform this wash unless told not to do so by your physician.  Enclosed: 1 Ice pack (please put in freezer the night before surgery)   1 Hibiclens skin cleaner   Pre-op Instructions  If you have any questions regarding the instructions, do not hesitate to call our office.  [place]: [address] [place], [place] [address] [phone]  [place]: [address] [place], [place] [address] [phone]  [place]: [address]  [place], [place] [address] [phone]  Dr. Richard   Tuchman DPM, Dr. Norman Regal DPM Dr. Richard Sikora DPM, Dr. M. Todd Hyatt DPM, Dr. Kathryn Egerton DPM 

## 2013-10-20 NOTE — Progress Notes (Signed)
Cameron Mcneil presents today as a 47 year old black male for followup of his fractured medial condyle hallux right. At this point he still complaining of pain on range of motion of the toe. He is requesting surgical intervention.  Objective: Vital signs are stable he is alert and oriented x3. Pulses are strongly palpable right lower extremity. Radiographic evaluation does demonstrate well-healing fracture medial condyle hallux right. He has limited range of motion to the first metatarsophalangeal joint of the right foot on physical exam. There is crepitation noted. There is considerable pain with range of motion.  Assessment: Well-healing fracture toe. Severe osteoarthritis first metatarsophalangeal joint of the right foot.  Plan: Discussed etiology pathology conservative versus surgical therapies we discussed options in detail today regarding surgical correction of the first metatarsophalangeal joint. We discussed a fusion. We discussed a two-piece implant versus a one-piece joint replacement. We also discussed a single silicone implant joint replacement. We also discussed a cleanup of the metatarsophalangeal joint. After this thorough discussion we consented him for a Keller arthroplasty with single silicone implant to the first metatarsophalangeal joint of the right foot. We did discuss a possible postop complications which may include but are not limited to postop pain bleeding swelling and infection. He understands this and is amenable to it. He was dispensed a Cam Walker today or his postop. And I will followup with him in the next few weeks for surgery.

## 2013-11-05 ENCOUNTER — Other Ambulatory Visit: Payer: Self-pay | Admitting: Podiatry

## 2013-11-05 MED ORDER — OXYCODONE-ACETAMINOPHEN 10-325 MG PO TABS: ORAL_TABLET | ORAL | Status: DC

## 2013-11-05 MED ORDER — CEPHALEXIN 500 MG PO CAPS: 500.0000 mg | ORAL_CAPSULE | Freq: Three times a day (TID) | ORAL | Status: DC

## 2013-11-05 MED ORDER — PROMETHAZINE HCL 12.5 MG PO TABS: 25.0000 mg | ORAL_TABLET | Freq: Four times a day (QID) | ORAL | Status: DC | PRN

## 2013-11-06 ENCOUNTER — Encounter: Payer: Self-pay | Admitting: Podiatry

## 2013-11-06 DIAGNOSIS — M2020 Hallux rigidus, unspecified foot: Secondary | ICD-10-CM

## 2013-11-06 HISTORY — PX: TOE SURGERY: SHX1073

## 2013-11-09 ENCOUNTER — Telehealth: Payer: Self-pay | Admitting: *Deleted

## 2013-11-09 NOTE — Telephone Encounter (Addendum)
Pt called left name, birthdate, and phone number only.  Pt asked if he could get a rx for a knee scooter.  I told him I would check with Dr Al Corpus, due to some surgeries needed a minimal amount of weightbearing tio promote bone healing.  Dr Al Corpus states pt doesn't need to be non-weightbearing for a keller bunion implant, he can have the foot below the heart and weightbearing up to 15 minutes per hour.  I informed the pt and he says he will need this to fly and travel.  Pt has an appt on Thursday for a post-op to discuss his surgery and range of activities.

## 2013-11-09 NOTE — Telephone Encounter (Signed)
Mr. Nannini had a keller arthroplasty with implant.  He is only allowed to have the leg dependent for 15 minutes out of each hour.  He is able to walk on this and I prefer he do so, but only for 15 minutes out of each hour.  I also encourage range of motion exercises. Thus, he should not need a knee scooter.  Ask him why he wants one and use your common sense from there base on what I have just written.

## 2013-11-11 ENCOUNTER — Emergency Department (HOSPITAL_COMMUNITY): Payer: BC Managed Care – PPO

## 2013-11-11 ENCOUNTER — Telehealth: Payer: Self-pay | Admitting: *Deleted

## 2013-11-11 ENCOUNTER — Encounter (HOSPITAL_COMMUNITY): Payer: Self-pay | Admitting: Emergency Medicine

## 2013-11-11 ENCOUNTER — Observation Stay (HOSPITAL_COMMUNITY)
Admission: EM | Admit: 2013-11-11 | Discharge: 2013-11-13 | Disposition: A | Discharge status: Home / Self Care | Payer: BC Managed Care – PPO | Attending: Orthopedic Surgery | Admitting: Orthopedic Surgery

## 2013-11-11 DIAGNOSIS — E291 Testicular hypofunction: Secondary | ICD-10-CM | POA: Insufficient documentation

## 2013-11-11 DIAGNOSIS — Y831 Surgical operation with implant of artificial internal device as the cause of abnormal reaction of the patient, or of later complication, without mention of misadventure at the time of the procedure: Secondary | ICD-10-CM | POA: Insufficient documentation

## 2013-11-11 DIAGNOSIS — S82841A Displaced bimalleolar fracture of right lower leg, initial encounter for closed fracture: Secondary | ICD-10-CM

## 2013-11-11 DIAGNOSIS — S82843A Displaced bimalleolar fracture of unspecified lower leg, initial encounter for closed fracture: Principal | ICD-10-CM

## 2013-11-11 DIAGNOSIS — T8131XA Disruption of external operation (surgical) wound, not elsewhere classified, initial encounter: Secondary | ICD-10-CM | POA: Insufficient documentation

## 2013-11-11 DIAGNOSIS — F528 Other sexual dysfunction not due to a substance or known physiological condition: Secondary | ICD-10-CM | POA: Insufficient documentation

## 2013-11-11 DIAGNOSIS — W108XXA Fall (on) (from) other stairs and steps, initial encounter: Secondary | ICD-10-CM | POA: Insufficient documentation

## 2013-11-11 MED ORDER — PROPOFOL 10 MG/ML IV BOLUS
0.5000 mg/kg | Freq: Once | INTRAVENOUS | Status: AC
  Administered 2013-11-11: 50 mg via INTRAVENOUS
  Filled 2013-11-11: qty 20

## 2013-11-11 MED ORDER — FENTANYL CITRATE 0.05 MG/ML IJ SOLN
100.0000 ug | Freq: Once | INTRAMUSCULAR | Status: AC
  Administered 2013-11-11: 100 ug via INTRAVENOUS

## 2013-11-11 MED ORDER — HYDROMORPHONE HCL PF 1 MG/ML IJ SOLN
1.0000 mg | Freq: Once | INTRAMUSCULAR | Status: AC
  Administered 2013-11-11: 1 mg via INTRAVENOUS
  Filled 2013-11-11: qty 1

## 2013-11-11 MED ORDER — NALOXONE HCL 0.4 MG/ML IJ SOLN
INTRAMUSCULAR | Status: AC
  Filled 2013-11-11: qty 1

## 2013-11-11 MED ORDER — FENTANYL CITRATE 0.05 MG/ML IJ SOLN
200.0000 ug | Freq: Once | INTRAMUSCULAR | Status: AC
  Administered 2013-11-11: 200 ug via INTRAVENOUS
  Filled 2013-11-11: qty 4

## 2013-11-11 MED ORDER — FENTANYL CITRATE 0.05 MG/ML IJ SOLN
INTRAMUSCULAR | Status: AC
  Filled 2013-11-11: qty 2

## 2013-11-11 MED ORDER — KETAMINE HCL 10 MG/ML IJ SOLN
100.0000 mg | Freq: Once | INTRAMUSCULAR | Status: AC
Start: 2013-11-11 — End: 2013-11-11
  Administered 2013-11-11: 50 mg via INTRAVENOUS
  Filled 2013-11-11: qty 10

## 2013-11-11 NOTE — ED Notes (Signed)
Had recent silicone joint replacement in toe

## 2013-11-11 NOTE — ED Provider Notes (Signed)
CSN: [id number]     Arrival date & time 11/11/13  1944 History   First MD Initiated Contact with Patient 11/11/13 2205     Chief Complaint  Patient presents with  . Ankle Injury   (Consider location/radiation/quality/duration/timing/severity/associated sxs/prior Treatment) Patient is a 47 y.o. male presenting with lower extremity injury. The history is provided by the patient and the spouse.  Ankle Injury   patient here after injuring his right ankle when he fell down the steps. Twisted his right ankle and foot a pop. Was unable to cannulate on this after the incident. Denies any head injury. No back or hip pain. Pain is characterized as sharp and worse with walking. No treatment used prior to arrival. Did recently have right great toe surgery done.  Past Medical History  Diagnosis Date  . Allergic rhinitis   . Hypogonadism male   . ED (erectile dysfunction)    History reviewed. No pertinent past surgical history. Family History  Problem Relation Age of Onset  . Hypertension Mother   . Prostate cancer Neg Hx   . Colon cancer Neg Hx   . Diabetes Other    History  Substance Use Topics  . Smoking status: Never Smoker   . Smokeless tobacco: Never Used  . Alcohol Use: Yes    Review of Systems  All other systems reviewed and are negative.    Allergies  Review of patient's allergies indicates no known allergies.  Home Medications   Current Outpatient Rx  Name  Route  Sig  Dispense  Refill  . cephALEXin (KEFLEX) 500 MG capsule   Oral   Take 1 capsule (500 mg total) by mouth 3 (three) times daily.   30 capsule   1   . oxyCODONE-acetaminophen (PERCOCET) 10-325 MG per tablet      Take one to two tablets by mouth every six to eight hours as needed for pain.   50 tablet   0   . promethazine (PHENERGAN) 12.5 MG tablet   Oral   Take 2 tablets (25 mg total) by mouth every 6 (six) hours as needed for nausea or vomiting.   30 tablet   1    BP 165/112  Pulse 72   Temp(Src) 97.6 F (36.4 C) (Oral)  Resp 18  Ht 6\' 2"  (1.88 m)  Wt 260 lb (117.935 kg)  BMI 33.37 kg/m2  SpO2 98% Physical Exam  Nursing note and vitals reviewed. Constitutional: He is oriented to person, place, and time. He appears well-developed and well-nourished.  Non-toxic appearance. No distress.  HENT:  Head: Normocephalic and atraumatic.  Eyes: Conjunctivae, EOM and lids are normal. Pupils are equal, round, and reactive to light.  Neck: Normal range of motion. Neck supple. No tracheal deviation present. No mass present.  Cardiovascular: Normal rate, regular rhythm and normal heart sounds.  Exam reveals no gallop.   No murmur heard. Pulmonary/Chest: Effort normal and breath sounds normal. No stridor. No respiratory distress. He has no decreased breath sounds. He has no wheezes. He has no rhonchi. He has no rales.  Abdominal: Soft. Normal appearance and bowel sounds are normal. He exhibits no distension. There is no tenderness. There is no rebound and no CVA tenderness.  Musculoskeletal: Normal range of motion. He exhibits no edema and no tenderness.       Feet:  Neurological: He is alert and oriented to person, place, and time. He has normal strength. No cranial nerve deficit or sensory deficit. GCS eye subscore is 4. GCS  verbal subscore is 5. GCS motor subscore is 6.  Skin: Skin is warm and dry. No abrasion and no rash noted.  Psychiatric: He has a normal mood and affect. His speech is normal and behavior is normal.    ED Course  Procedural sedation Date/Time: 11/12/2013 12:08 AM Performed by: Toy Baker Authorized by: Lorre Nick T Consent: Verbal consent obtained. written consent obtained. Consent given by: patient Patient understanding: patient states understanding of the procedure being performed Patient consent: the patient's understanding of the procedure matches consent given Patient identity confirmed: verbally with patient Time out: Immediately prior to  procedure a "time out" was called to verify the correct patient, procedure, equipment, support staff and site/side marked as required. Patient sedated: yes Sedatives: ketamine and propofol Sedation start date/time: 11/11/2013 11:50 PM Sedation end date/time: 11/12/2013 12:09 AM Vitals: Vital signs were monitored during sedation. Patient tolerance: Patient tolerated the procedure well with no immediate complications.   (including critical care time) Labs Review Labs Reviewed - No data to display Imaging Review Dg Ankle Complete Right  11/11/2013   CLINICAL DATA:  Ankle pain after falling today. Recent great toe surgery.  EXAM: RIGHT ANKLE - COMPLETE 3+ VIEW  COMPARISON:  10/20/2013 radiographs.  FINDINGS: Positioning is limited by the patient's injury. There is posterolateral dislocation of the tibiotalar joint with displaced fractures of the medial malleolus and distal fibula. The fibular fracture is displaced laterally by 2.0 cm. No gross talar dome fracture is identified. There is new irregularity of the 1st metatarsal head, presumably related to the recent surgery. Ice bags overlie the ankle.  IMPRESSION: Posterolateral dislocation at the ankle with fractures of the distal fibula and medial malleolus. Presumed postsurgical changes involving the 1st metatarsal head.   Electronically Signed   By: Roxy Horseman M.D.   On: 11/11/2013 21:23    EKG Interpretation   None       MDM  No diagnosis found. Patient given fentanyl for pain control and I then attempted to reduce the patient's fracture unsuccessfully. Consult orthopedics made. Dr. Victorino Dike relocated the patient's ankle while I provided conscious sedation. Patient does have a wound dehiscence at the surgical site at the base of the right first toe. Patient will be admitted by Dr. Barry Dienes, MD 11/12/13 [phone]

## 2013-11-11 NOTE — Telephone Encounter (Signed)
Pt called complained of tight bandage when he puts his foot down.  I instructed pt to remove the ace only and to elevate his foot for 15 minutes, then rewrap looser.  Pt states understanding.

## 2013-11-11 NOTE — ED Notes (Signed)
Narcan at [facility].

## 2013-11-11 NOTE — ED Notes (Signed)
Pt going up steps and fell. Ankle laying to right. He is able to wiggle toes. Skin warm, and sensation present.

## 2013-11-11 NOTE — Progress Notes (Signed)
Orthopedic Tech Progress Note Patient Details:  Cameron Mcneil [name] 1966-02-20 [id number]  Ortho Devices Type of Ortho Device: Ace wrap;Post (short leg) splint Ortho Device/Splint Location: RLE Ortho Device/Splint Interventions: Ordered;Application   Jennye Moccasin 11/11/2013, 10:52 PM

## 2013-11-12 ENCOUNTER — Emergency Department (HOSPITAL_COMMUNITY): Payer: BC Managed Care – PPO

## 2013-11-12 ENCOUNTER — Observation Stay (HOSPITAL_COMMUNITY): Payer: BC Managed Care – PPO

## 2013-11-12 ENCOUNTER — Encounter (HOSPITAL_COMMUNITY): Payer: Self-pay | Admitting: General Practice

## 2013-11-12 ENCOUNTER — Encounter (HOSPITAL_COMMUNITY)
Admission: EM | Disposition: A | Discharge status: Home / Self Care | Payer: Self-pay | Source: Home / Self Care | Attending: Emergency Medicine

## 2013-11-12 ENCOUNTER — Observation Stay (HOSPITAL_COMMUNITY): Payer: BC Managed Care – PPO | Admitting: Anesthesiology

## 2013-11-12 ENCOUNTER — Encounter (HOSPITAL_COMMUNITY): Payer: BC Managed Care – PPO | Admitting: Anesthesiology

## 2013-11-12 ENCOUNTER — Encounter: Payer: BC Managed Care – PPO | Admitting: Podiatry

## 2013-11-12 DIAGNOSIS — S82843A Displaced bimalleolar fracture of unspecified lower leg, initial encounter for closed fracture: Secondary | ICD-10-CM

## 2013-11-12 HISTORY — PX: ORIF ANKLE FRACTURE: SHX5408

## 2013-11-12 LAB — CBC
HCT: 44.3 % (ref 39.0–52.0)
Hemoglobin: 14.8 g/dL (ref 13.0–17.0)
MCH: 27 pg (ref 26.0–34.0)
MCHC: 33.4 g/dL (ref 30.0–36.0)
MCV: 80.7 fL (ref 78.0–100.0)

## 2013-11-12 LAB — POCT I-STAT 4, (NA,K, GLUC, HGB,HCT)
Glucose, Bld: 102 mg/dL — ABNORMAL HIGH (ref 70–99)
Potassium: 3.7 mEq/L (ref 3.5–5.1)

## 2013-11-12 LAB — SURGICAL PCR SCREEN
MRSA, PCR: NEGATIVE
Staphylococcus aureus: NEGATIVE

## 2013-11-12 SURGERY — OPEN REDUCTION INTERNAL FIXATION (ORIF) ANKLE FRACTURE
Anesthesia: General | Site: Ankle | Laterality: Right

## 2013-11-12 MED ORDER — BUPIVACAINE LIPOSOME 1.3 % IJ SUSP
20.0000 mL | Freq: Once | INTRAMUSCULAR | Status: DC
  Filled 2013-11-12: qty 20

## 2013-11-12 MED ORDER — OXYCODONE HCL 5 MG PO TABS: 5.0000 mg | ORAL_TABLET | Freq: Once | ORAL | Status: DC | PRN

## 2013-11-12 MED ORDER — OXYCODONE-ACETAMINOPHEN 5-325 MG PO TABS
1.0000 | ORAL_TABLET | Freq: Four times a day (QID) | ORAL | Status: DC | PRN
  Administered 2013-11-12: 1 via ORAL
  Filled 2013-11-12: qty 1

## 2013-11-12 MED ORDER — OXYCODONE-ACETAMINOPHEN 5-325 MG PO TABS: 2.0000 | ORAL_TABLET | Freq: Four times a day (QID) | ORAL | Status: DC | PRN

## 2013-11-12 MED ORDER — PHENYLEPHRINE HCL 10 MG/ML IJ SOLN
INTRAMUSCULAR | Status: DC | PRN
  Administered 2013-11-12 (×2): 80 ug via INTRAVENOUS

## 2013-11-12 MED ORDER — ROCURONIUM BROMIDE 100 MG/10ML IV SOLN
INTRAVENOUS | Status: DC | PRN
  Administered 2013-11-12: 50 mg via INTRAVENOUS

## 2013-11-12 MED ORDER — BACITRACIN ZINC 500 UNIT/GM EX OINT
TOPICAL_OINTMENT | CUTANEOUS | Status: DC | PRN
  Administered 2013-11-12: 1 via TOPICAL

## 2013-11-12 MED ORDER — DOCUSATE SODIUM 100 MG PO CAPS
100.0000 mg | ORAL_CAPSULE | Freq: Two times a day (BID) | ORAL | Status: DC
  Filled 2013-11-12 (×2): qty 1

## 2013-11-12 MED ORDER — PHENYLEPHRINE HCL 10 MG/ML IJ SOLN
10.0000 mg | INTRAVENOUS | Status: DC | PRN
  Administered 2013-11-12: 20 ug/min via INTRAVENOUS

## 2013-11-12 MED ORDER — METOCLOPRAMIDE HCL 10 MG PO TABS: 5.0000 mg | ORAL_TABLET | Freq: Three times a day (TID) | ORAL | Status: DC | PRN

## 2013-11-12 MED ORDER — METOCLOPRAMIDE HCL 5 MG/ML IJ SOLN: 5.0000 mg | Freq: Three times a day (TID) | INTRAMUSCULAR | Status: DC | PRN

## 2013-11-12 MED ORDER — BUPIVACAINE HCL (PF) 0.25 % IJ SOLN
INTRAMUSCULAR | Status: AC
  Filled 2013-11-12: qty 30

## 2013-11-12 MED ORDER — CEFAZOLIN SODIUM 1 G IJ SOLR
3.0000 g | INTRAMUSCULAR | Status: AC
  Administered 2013-11-12: 3 g via INTRAVENOUS
  Filled 2013-11-12: qty 30

## 2013-11-12 MED ORDER — BUPIVACAINE-EPINEPHRINE PF 0.5-1:200000 % IJ SOLN
INTRAMUSCULAR | Status: DC | PRN
  Administered 2013-11-12: 150 mg via PERINEURAL

## 2013-11-12 MED ORDER — DEXAMETHASONE SODIUM PHOSPHATE 10 MG/ML IJ SOLN
INTRAMUSCULAR | Status: DC | PRN
  Administered 2013-11-12: 6 mg

## 2013-11-12 MED ORDER — OXYCODONE HCL 5 MG PO TABS
5.0000 mg | ORAL_TABLET | Freq: Four times a day (QID) | ORAL | Status: DC | PRN
  Administered 2013-11-12: 5 mg via ORAL
  Filled 2013-11-12: qty 1

## 2013-11-12 MED ORDER — NEOSTIGMINE METHYLSULFATE 1 MG/ML IJ SOLN
INTRAMUSCULAR | Status: DC | PRN
  Administered 2013-11-12: 3 mg via INTRAVENOUS

## 2013-11-12 MED ORDER — ONDANSETRON HCL 4 MG/2ML IJ SOLN
4.0000 mg | Freq: Four times a day (QID) | INTRAMUSCULAR | Status: DC | PRN
  Administered 2013-11-12: 4 mg via INTRAVENOUS
  Filled 2013-11-12: qty 2

## 2013-11-12 MED ORDER — ONDANSETRON HCL 4 MG PO TABS: 4.0000 mg | ORAL_TABLET | Freq: Four times a day (QID) | ORAL | Status: DC | PRN

## 2013-11-12 MED ORDER — ONDANSETRON HCL 4 MG/2ML IJ SOLN
INTRAMUSCULAR | Status: DC | PRN
  Administered 2013-11-12: 4 mg via INTRAVENOUS

## 2013-11-12 MED ORDER — SODIUM CHLORIDE 0.9 % IV SOLN
INTRAVENOUS | Status: DC
  Administered 2013-11-12: 01:00:00 via INTRAVENOUS

## 2013-11-12 MED ORDER — LACTATED RINGERS IV SOLN
INTRAVENOUS | Status: DC | PRN
  Administered 2013-11-12 (×2): via INTRAVENOUS

## 2013-11-12 MED ORDER — ONDANSETRON HCL 4 MG/2ML IJ SOLN: 4.0000 mg | Freq: Four times a day (QID) | INTRAMUSCULAR | Status: DC | PRN

## 2013-11-12 MED ORDER — BACITRACIN ZINC 500 UNIT/GM EX OINT
TOPICAL_OINTMENT | CUTANEOUS | Status: AC
  Filled 2013-11-12: qty 15

## 2013-11-12 MED ORDER — SODIUM CHLORIDE 0.9 % IV SOLN: INTRAVENOUS | Status: DC

## 2013-11-12 MED ORDER — 0.9 % SODIUM CHLORIDE (POUR BTL) OPTIME
TOPICAL | Status: DC | PRN
  Administered 2013-11-12: 1000 mL

## 2013-11-12 MED ORDER — MIDAZOLAM HCL 5 MG/5ML IJ SOLN
INTRAMUSCULAR | Status: DC | PRN
  Administered 2013-11-12: 2 mg via INTRAVENOUS

## 2013-11-12 MED ORDER — SENNA 8.6 MG PO TABS
2.0000 | ORAL_TABLET | Freq: Two times a day (BID) | ORAL | Status: DC
  Administered 2013-11-13: 17.2 mg via ORAL
  Filled 2013-11-12 (×5): qty 2

## 2013-11-12 MED ORDER — ENOXAPARIN SODIUM 40 MG/0.4ML ~~LOC~~ SOLN
40.0000 mg | SUBCUTANEOUS | Status: DC
  Administered 2013-11-12: 40 mg via SUBCUTANEOUS
  Filled 2013-11-12 (×2): qty 0.4

## 2013-11-12 MED ORDER — PROMETHAZINE HCL 25 MG/ML IJ SOLN: 6.2500 mg | INTRAMUSCULAR | Status: DC | PRN

## 2013-11-12 MED ORDER — LIDOCAINE HCL 4 % MT SOLN
OROMUCOSAL | Status: DC | PRN
  Administered 2013-11-12: 4 mL via TOPICAL

## 2013-11-12 MED ORDER — DEXTROSE 5 % IV SOLN
2.0000 g | Freq: Three times a day (TID) | INTRAVENOUS | Status: DC
  Administered 2013-11-12 (×2): 2 g via INTRAVENOUS
  Filled 2013-11-12 (×5): qty 20

## 2013-11-12 MED ORDER — HYDROMORPHONE HCL PF 1 MG/ML IJ SOLN
1.0000 mg | INTRAMUSCULAR | Status: DC | PRN
  Administered 2013-11-12 (×2): 1 mg via INTRAVENOUS
  Filled 2013-11-12 (×2): qty 1

## 2013-11-12 MED ORDER — SODIUM CHLORIDE 0.9 % IJ SOLN
INTRAMUSCULAR | Status: DC | PRN
  Administered 2013-11-12: 17:00:00

## 2013-11-12 MED ORDER — OXYCODONE-ACETAMINOPHEN 10-325 MG PO TABS: 1.0000 | ORAL_TABLET | Freq: Four times a day (QID) | ORAL | Status: DC | PRN

## 2013-11-12 MED ORDER — PROPOFOL 10 MG/ML IV BOLUS
INTRAVENOUS | Status: DC | PRN
  Administered 2013-11-12: 200 mg via INTRAVENOUS

## 2013-11-12 MED ORDER — LABETALOL HCL 5 MG/ML IV SOLN
INTRAVENOUS | Status: DC | PRN
  Administered 2013-11-12: 10 mg via INTRAVENOUS

## 2013-11-12 MED ORDER — OXYCODONE HCL 5 MG PO TABS: 10.0000 mg | ORAL_TABLET | Freq: Four times a day (QID) | ORAL | Status: DC | PRN

## 2013-11-12 MED ORDER — FENTANYL CITRATE 0.05 MG/ML IJ SOLN
INTRAMUSCULAR | Status: DC | PRN
  Administered 2013-11-12: 150 ug via INTRAVENOUS
  Administered 2013-11-12: 100 ug via INTRAVENOUS

## 2013-11-12 MED ORDER — MORPHINE SULFATE 2 MG/ML IJ SOLN
2.0000 mg | INTRAMUSCULAR | Status: DC | PRN
  Administered 2013-11-12 (×3): 2 mg via INTRAVENOUS
  Filled 2013-11-12 (×3): qty 1

## 2013-11-12 MED ORDER — OXYCODONE HCL 5 MG/5ML PO SOLN: 5.0000 mg | Freq: Once | ORAL | Status: DC | PRN

## 2013-11-12 MED ORDER — HYDROMORPHONE HCL PF 1 MG/ML IJ SOLN: 0.2500 mg | INTRAMUSCULAR | Status: DC | PRN

## 2013-11-12 MED ORDER — GLYCOPYRROLATE 0.2 MG/ML IJ SOLN
INTRAMUSCULAR | Status: DC | PRN
  Administered 2013-11-12: 0.4 mg via INTRAVENOUS

## 2013-11-12 SURGICAL SUPPLY — 74 items
BANDAGE ESMARK 6X9 LF (GAUZE/BANDAGES/DRESSINGS) ×1 IMPLANT
BANDAGE GAUZE ELAST BULKY 4 IN (GAUZE/BANDAGES/DRESSINGS) ×1 IMPLANT
BIT DRILL 2.5X2.75 QC CALB (BIT) ×1 IMPLANT
BIT DRILL 2.9 CANN QC NONSTRL (BIT) ×2 IMPLANT
BIT DRILL 3.5X5.5 QC CALB (BIT) ×2 IMPLANT
BLADE SURG 15 STRL LF DISP TIS (BLADE) ×1 IMPLANT
BLADE SURG 15 STRL SS (BLADE) ×2
BNDG CMPR 9X6 STRL LF SNTH (GAUZE/BANDAGES/DRESSINGS) ×1
BNDG COHESIVE 4X5 TAN STRL (GAUZE/BANDAGES/DRESSINGS) ×2 IMPLANT
BNDG COHESIVE 6X5 TAN STRL LF (GAUZE/BANDAGES/DRESSINGS) ×2 IMPLANT
BNDG ESMARK 6X9 LF (GAUZE/BANDAGES/DRESSINGS) ×2
CHLORAPREP W/TINT 26ML (MISCELLANEOUS) IMPLANT
CLOTH BEACON ORANGE TIMEOUT ST (SAFETY) ×1 IMPLANT
COVER SURGICAL LIGHT HANDLE (MISCELLANEOUS) ×2 IMPLANT
CUFF TOURNIQUET SINGLE 34IN LL (TOURNIQUET CUFF) ×2 IMPLANT
CUFF TOURNIQUET SINGLE 44IN (TOURNIQUET CUFF) IMPLANT
DRAPE C-ARM 42X72 X-RAY (DRAPES) ×2 IMPLANT
DRAPE C-ARMOR (DRAPES) ×2 IMPLANT
DRAPE U-SHAPE 47X51 STRL (DRAPES) ×2 IMPLANT
DRSG ADAPTIC 3X8 NADH LF (GAUZE/BANDAGES/DRESSINGS) ×1 IMPLANT
DRSG PAD ABDOMINAL 8X10 ST (GAUZE/BANDAGES/DRESSINGS) ×3 IMPLANT
ELECT REM PT RETURN 9FT ADLT (ELECTROSURGICAL) ×2
ELECTRODE REM PT RTRN 9FT ADLT (ELECTROSURGICAL) ×1 IMPLANT
GAUZE XEROFORM 5X9 LF (GAUZE/BANDAGES/DRESSINGS) ×1 IMPLANT
GLOVE BIO SURGEON STRL SZ7 (GLOVE) ×2 IMPLANT
GLOVE BIO SURGEON STRL SZ8 (GLOVE) ×2 IMPLANT
GLOVE BIOGEL PI IND STRL 7.5 (GLOVE) ×3 IMPLANT
GLOVE BIOGEL PI IND STRL 8 (GLOVE) ×1 IMPLANT
GLOVE BIOGEL PI INDICATOR 7.5 (GLOVE) ×3
GLOVE BIOGEL PI INDICATOR 8 (GLOVE) ×1
GOWN PREVENTION PLUS XLARGE (GOWN DISPOSABLE) ×2 IMPLANT
GOWN STRL NON-REIN LRG LVL3 (GOWN DISPOSABLE) ×4 IMPLANT
K-WIRE ACE 1.6X6 (WIRE) ×4
KIT BASIN OR (CUSTOM PROCEDURE TRAY) ×2 IMPLANT
KIT ROOM TURNOVER OR (KITS) ×2 IMPLANT
KWIRE ACE 1.6X6 (WIRE) IMPLANT
MANIFOLD NEPTUNE II (INSTRUMENTS) ×2 IMPLANT
NEEDLE 22X1 1/2 (OR ONLY) (NEEDLE) ×1 IMPLANT
NS IRRIG 1000ML POUR BTL (IV SOLUTION) ×2 IMPLANT
PACK ORTHO EXTREMITY (CUSTOM PROCEDURE TRAY) ×2 IMPLANT
PAD ARMBOARD 7.5X6 YLW CONV (MISCELLANEOUS) ×6 IMPLANT
PAD CAST 4YDX4 CTTN HI CHSV (CAST SUPPLIES) ×2 IMPLANT
PADDING CAST ABS 6INX4YD NS (CAST SUPPLIES) ×1
PADDING CAST ABS COTTON 6X4 NS (CAST SUPPLIES) ×1 IMPLANT
PADDING CAST COTTON 4X4 STRL (CAST SUPPLIES) ×4
SCREW ACE CAN 4.0 50M (Screw) ×1 IMPLANT
SCREW CORT FT 32X3.5XNONLOCK (Screw) IMPLANT
SCREW CORTICAL 3.5MM  32MM (Screw) ×1 IMPLANT
SCREW CORTICAL 3.5MM 26MM (Screw) ×1 IMPLANT
SCREW CORTICAL 3.5MM 32MM (Screw) ×1 IMPLANT
SCREW CORTICAL LOW PROF 3.5X20 (Screw) ×2 IMPLANT
SCREW LOCK 3.5X14 DIST TIB (Screw) ×1 IMPLANT
SCREW LOCK CORT STAR 3.5X10 (Screw) ×1 IMPLANT
SCREW LOCK CORT STAR 3.5X14 (Screw) ×1 IMPLANT
SCREW LOW PROFILE 18MMX3.5MM (Screw) ×2 IMPLANT
SCREW NON LOCKING LP 3.5 16MM (Screw) ×1 IMPLANT
SPLINT PLASTER CAST XFAST 5X30 (CAST SUPPLIES) IMPLANT
SPLINT PLASTER XFAST SET 5X30 (CAST SUPPLIES) ×2
SPONGE GAUZE 4X4 12PLY (GAUZE/BANDAGES/DRESSINGS) ×1 IMPLANT
SPONGE LAP 18X18 X RAY DECT (DISPOSABLE) ×2 IMPLANT
STAPLER VISISTAT 35W (STAPLE) IMPLANT
SUCTION FRAZIER TIP 10 FR DISP (SUCTIONS) ×2 IMPLANT
SUT ETHILON 3 0 PS 1 (SUTURE) ×3 IMPLANT
SUT MNCRL AB 3-0 PS2 18 (SUTURE) ×2 IMPLANT
SUT PROLENE 3 0 PS 2 (SUTURE) ×2 IMPLANT
SUT VIC AB 2-0 CT1 27 (SUTURE) ×4
SUT VIC AB 2-0 CT1 TAPERPNT 27 (SUTURE) ×2 IMPLANT
SUT VIC AB 3-0 PS2 18 (SUTURE) ×2
SUT VIC AB 3-0 PS2 18XBRD (SUTURE) ×1 IMPLANT
SYR CONTROL 10ML LL (SYRINGE) IMPLANT
TOWEL OR 17X24 6PK STRL BLUE (TOWEL DISPOSABLE) ×2 IMPLANT
TOWEL OR 17X26 10 PK STRL BLUE (TOWEL DISPOSABLE) ×2 IMPLANT
TUBE CONNECTING 12X1/4 (SUCTIONS) ×2 IMPLANT
WATER STERILE IRR 1000ML POUR (IV SOLUTION) ×2 IMPLANT

## 2013-11-12 NOTE — Transfer of Care (Signed)
Immediate Anesthesia Transfer of Care Note  Patient: Cameron Mcneil  Procedure(s) Performed: Procedure(s): OPEN REDUCTION INTERNAL FIXATION (ORIF) ANKLE FRACTURE/ irrigation and debridement of right forefoot wound  dehisence (Right)  Patient Location: PACU  Anesthesia Type:General  Level of Consciousness: awake, oriented, sedated and patient cooperative  Airway & Oxygen Therapy: Patient Spontanous Breathing and Patient connected to nasal cannula oxygen  Post-op Assessment: Report given to PACU RN, Post -op Vital signs reviewed and stable and Patient moving all extremities  Post vital signs: Reviewed and stable  Complications: No apparent anesthesia complications

## 2013-11-12 NOTE — Anesthesia Preprocedure Evaluation (Signed)
Anesthesia Evaluation    Reviewed: Allergy & Precautions, H&P , NPO status , Patient's Chart, lab work & pertinent test results  History of Anesthesia Complications Negative for: history of anesthetic complications  Airway       Dental   Pulmonary neg pulmonary ROS,          Cardiovascular hypertension, Pt. on medications     Neuro/Psych negative neurological ROS  negative psych ROS   GI/Hepatic negative GI ROS, Neg liver ROS,   Endo/Other  negative endocrine ROS  Renal/GU negative Renal ROS     Musculoskeletal   Abdominal   Peds  Hematology negative hematology ROS (+)   Anesthesia Other Findings   Reproductive/Obstetrics                           Anesthesia Physical Anesthesia Plan  ASA: II  Anesthesia Plan: General   Post-op Pain Management:    Induction: Intravenous  Airway Management Planned: LMA  Additional Equipment:   Intra-op Plan:   Post-operative Plan: Extubation in OR  Informed Consent:   Plan Discussed with: CRNA, Anesthesiologist and Surgeon  Anesthesia Plan Comments:         Anesthesia Quick Evaluation

## 2013-11-12 NOTE — Anesthesia Procedure Notes (Addendum)
Anesthesia Regional Block:  Popliteal block  Pre-Anesthetic Checklist: ,, timeout performed, Correct Patient, Correct Site, Correct Laterality, Correct Procedure, Correct Position, site marked, Risks and benefits discussed,  Surgical consent,  Pre-op evaluation,  At surgeon's request and post-op pain management  Laterality: Right  Prep: chloraprep       Needles:  Injection technique: Single-shot  Needle Type: Echogenic Stimulator Needle          Additional Needles:  Procedures: ultrasound guided (picture in chart) and nerve stimulator Popliteal block  Nerve Stimulator or Paresthesia:  Response: plantar flexion, 0.45 mA,   Additional Responses:   Narrative:  Start time: 11/12/2013 3:12 PM End time: 11/12/2013 3:22 PM  Performed by: Personally  Anesthesiologist: J. Adonis Huguenin, MD  Additional Notes: A functioning IV was confirmed and monitors were applied.  Sterile prep and drape, hand hygiene and sterile gloves were used.  Negative aspiration and test dose prior to incremental administration of local anesthetic. The patient tolerated the procedure well.Ultrasound  guidance: relevant anatomy identified, needle position confirmed, local anesthetic spread visualized around nerve(s), vascular puncture avoided.  Image printed for medical record.    Procedure Name: Intubation Date/Time: 11/12/2013 3:39 PM Performed by: Coralee Rud Pre-anesthesia Checklist: Patient identified, Emergency Drugs available and Suction available Patient Re-evaluated:Patient Re-evaluated prior to inductionOxygen Delivery Method: Circle system utilized Preoxygenation: Pre-oxygenation with 100% oxygen Intubation Type: IV induction Ventilation: Mask ventilation without difficulty Laryngoscope Size: Miller and 3 Grade View: Grade I Tube type: Oral Tube size: 8.0 mm Number of attempts: 1 Airway Equipment and Method: Stylet Placement Confirmation: positive ETCO2,  ETT inserted through vocal cords  under direct vision and breath sounds checked- equal and bilateral Secured at: 23 cm Tube secured with: Tape Dental Injury: Teeth and Oropharynx as per pre-operative assessment

## 2013-11-12 NOTE — H&P (Signed)
Cameron Mcneil is an 47 y.o. male.   Chief Complaint:  Right ankle injury HPI:  47 y/o male without PMH fell on his stairs at home today injuring his right ankle.  He had right hallux MPJ replacement by Dr. Al Corpus 2 days ago.  He c/o severe pain at the right ankle that is worse with attempted motion and better with holding still.  Attempt was made at reduction of the ankle in the ER with IV fentanyl.  This was unsuccessful, and I was called to eval and treat the patient.  He denies any h/o injury to the right ankle.  He denies any h/o diabetes or smoking.  Past Medical History  Diagnosis Date  . Allergic rhinitis   . Hypogonadism male   . ED (erectile dysfunction)     PSH:  As above  Family History  Problem Relation Age of Onset  . Hypertension Mother   . Prostate cancer Neg Hx   . Colon cancer Neg Hx   . Diabetes Other    Social History:  reports that he has never smoked. He has never used smokeless tobacco. He reports that he drinks alcohol. He reports that he does not use illicit drugs.  Allergies: No Known Allergies   (Not in a hospital admission)  No results found for this or any previous visit (from the past 48 hour(s)). Dg Ankle Complete Right  11/11/2013   CLINICAL DATA:  Ankle pain after falling today. Recent great toe surgery.  EXAM: RIGHT ANKLE - COMPLETE 3+ VIEW  COMPARISON:  10/20/2013 radiographs.  FINDINGS: Positioning is limited by the patient's injury. There is posterolateral dislocation of the tibiotalar joint with displaced fractures of the medial malleolus and distal fibula. The fibular fracture is displaced laterally by 2.0 cm. No gross talar dome fracture is identified. There is new irregularity of the 1st metatarsal head, presumably related to the recent surgery. Ice bags overlie the ankle.  IMPRESSION: Posterolateral dislocation at the ankle with fractures of the distal fibula and medial malleolus. Presumed postsurgical changes involving the 1st metatarsal head.    Electronically Signed   By: Roxy Horseman M.D.   On: 11/11/2013 21:23   Dg Ankle Right Port  11/11/2013   CLINICAL DATA:  Postreduction ankle  EXAM: PORTABLE RIGHT ANKLE - 2 VIEW  COMPARISON:  Ankle x-ray from the same date at 9 p.m.  FINDINGS: Persistent posterior dislocation of the ankle with posterior malleolar fracture, medial ligamentous rupture, and distal fibular fracture.  IMPRESSION: Persistent posterior ankle dislocation. Fracture orientation appears similar to prior.   Electronically Signed   By: Tiburcio Pea M.D.   On: 11/11/2013 23:40    ROS  No recent f/c/nv/wt loss  Blood pressure 150/104, pulse 81, temperature 97.8 F (36.6 C), temperature source Oral, resp. rate 22, height 6\' 2"  (1.88 m), weight 117.935 kg (260 lb), SpO2 99.00%. Physical Exam  wn wd male in nad.  A and O x 4.  Mood and affect normal.  EOMI.  Resp unlabored.  Gait is NWB on the R LE.  R LE with gross deformity.  The skin is tented anteriorlly and medially by the anterior distal tibia and medial malleolus.  Brisk cap refill.  DP pulse is palpable.  Active 4/5 strength in PF and DF of the toes.  Sens to LT is intact dorsally and plantarly at the toes.  No lymphadenopathy is ntoed.  The dorsal hallux wound is open with hematoma at the skin incision.  No gross  contamination is noted.  There is no implant visible.  Assessment/Plan 1.  Right ankle bimal fracture dislocation - I explained the nature of the injury to the patient in detail.  He requires closed reduction of this fracture urgently.  He understands the risks and benefits of the alternative treatment options and elects to proceed.  PROCEDURE:  After informed consent was obtained and IV propofol and ketamine were administered by Dr. Freida Busman, the ankle was reduced with traction and plantarflexion.  After reduction, a splint was applied with the ankle in neutral.  The patient toleratd the procedure well without any evident complications.  Post reduction radiographs  were obtained showing acceptable alignment of the tibiotalar joint and fibula and posterior malleolus fractures.  He will require ORIF of the unstable ankle fracture likely tomorrow.  2.  Right hallux traumatic wound dehiscence - at this point I'll admit the patient and start IV abx.  He'll be NWB and keep it elevated overnight.  He'll need I and D and closure of the wound tomorrow.  The wound was redressed with application of the ankle splint.  The patient understands the plan and agrees.  Toni Arthurs 11/12/2013, 12:16 AM

## 2013-11-12 NOTE — Progress Notes (Signed)
Subjective: Pt denies any significant pain, f/c/n/v.  He presents now for operative treatment of his right ankle fracture dislocation and hallux MPJ wound dehiscence.   Objective: Vital signs in last 24 hours: Temp:  [97.6 F (36.4 C)-98.7 F (37.1 C)] 98.7 F (37.1 C) November 20, 2023 1433) Pulse Rate:  [65-99] 92 11/20/2023 1433) Resp:  [11-30] 18 20-Nov-2023 1433) BP: (132-187)/(83-126) 172/90 mmHg November 20, 2023 1433) SpO2:  [95 %-100 %] 97 % 2023-11-20 1433) Weight:  [117.935 kg (260 lb)] 117.935 kg (260 lb) (12/17 1957)  Intake/Output from previous day: 12/17 0701 - 20-Nov-2023 0700 In: 750 [I.V.:750] Out: 1 [Urine:1] Intake/Output this shift:    PE:  WN WD male in nad.  R foot immobliized in a short leg splint.  Flexes and extends hallux without difficulty or apparent pain.  SPlint in good repair.    Assessment/Plan: 1.  Right bimalleolar ankle fracture dislocation - to OR for ORIF.  2.  Right hallux MPJ dorsal wound dehiscence - to OR for I and D and wound closure.  The risks and benefits of the alternative treatment options have been discussed in detail.  The patient wishes to proceed with surgery and specifically understands risks of bleeding, infection, nerve damage, blood clots, need for additional surgery, amputation and death.    Toni Arthurs November 19, 2013, 3:09 PM

## 2013-11-12 NOTE — ED Notes (Signed)
Pt placed on 4L 02, pt sleeping. 02 sats reading 91% while sleeping on 2L. Pt's 02 sats reading 95% on 4L 02.

## 2013-11-12 NOTE — Brief Op Note (Signed)
11/11/2013 - 11/12/2013  5:38 PM  PATIENT:  Cameron Mcneil  47 y.o. male  PRE-OPERATIVE DIAGNOSIS: 1.  Right ankle bimalleolar fracture dislocation s/p closed reduction      2.  Wound dehiscence s/p right hallux MPJ joint replacement   POST-OPERATIVE DIAGNOSIS: same  Procedure(s): 1.  Open reduction and internal fixation of right ankle bimalleolar fracture 2.  Stress examination of right ankle under fluoro 3.  Irrigation and excisional debridement of right forefoot wound dehiscence 4.  Delayed closure of right forefoot wound dehiscence  SURGEON:  Toni Arthurs, MD  ASSISTANT: Lorin Picket Flowers, PA-C  ANESTHESIA:   General  EBL:  minimal   TOURNIQUET:   Total Tourniquet Time Documented: Thigh (Right) - 79 minutes Total: Thigh (Right) - 79 minutes   COMPLICATIONS:  None apparent  DISPOSITION:  Extubated, awake and stable to recovery.  DICTATION ID:  [id number]

## 2013-11-13 MED ORDER — OXYCODONE HCL 5 MG PO TABS: 10.0000 mg | ORAL_TABLET | ORAL | Status: DC | PRN

## 2013-11-13 NOTE — Progress Notes (Signed)
Subjective: 1 Day Post-Op Procedure(s) (LRB): OPEN REDUCTION INTERNAL FIXATION (ORIF) ANKLE FRACTURE/ irrigation and debridement of right forefoot wound  dehisence (Right) Patient doing well this AM, pain 6/10 but well controlled with oral medication.  Pt progressing with physical therapy, able to transfer to chair and bathroom.  Pt does c/o numbness to lower extremity which is likely due to the nerve block.  Pt denies N/V/F/C, chest pain, SOB, paresthesia or change in appetite  Objective: Vital signs in last 24 hours: Temp:  [97.3 F (36.3 C)-99.8 F (37.7 C)] 97.3 F (36.3 C) (12/19 0441) Pulse Rate:  [92-109] 103 (12/19 0441) Resp:  [18-25] 18 (12/19 0441) BP: (138-172)/(82-102) 138/85 mmHg (12/19 0441) SpO2:  [93 %-98 %] 94 % (12/19 0441)  Intake/Output from previous day: 12/18 0701 - 12/19 0700 In: 2960 [P.O.:960; I.V.:2000] Out: 3200 [Urine:3200] Intake/Output this shift: Total I/O In: 240 [P.O.:240] Out: -    Recent Labs  11/12/13 1506 11/12/13 1930  HGB 16.3 14.8    Recent Labs  11/12/13 1506 11/12/13 1930  WBC  --  10.9*  RBC  --  5.49  HCT 48.0 44.3  PLT  --  224    Recent Labs  11/12/13 1506 11/12/13 1930  NA 139  --   K 3.7  --   CREATININE  --  1.14  GLUCOSE 102*  --    No results found for this basename: LABPT, INR,  in the last 72 hours  WD WN 47 y/o male in NAD, A/Ox3, appears stated age.  EOMI, mood and affect normal, respirations unlabored.  On physical exam pt presents in therapy room standing with no discomfort during exam.  Toes non-mobile, with decreased sensation to light touch.  Toes well perfused with cap refill < 2 secs. Splint is proper placement and in good repair.  Assessment/Plan: 1 Day Post-Op Procedure(s) (LRB): OPEN REDUCTION INTERNAL FIXATION (ORIF) ANKLE FRACTURE/ irrigation and debridement of right forefoot wound  dehisence (Right) Pt will be discharged home today.  He will start Aspirin 325mg  qday x 6 weeks for  anticoagulation.  He will remain NWB on right leg and keep splint clean and dry.  Leg is to be elevated when pt is resting.  Pt agrees with and understands my assessment and plan and will f/u as scheduled in 2 weeks with Dr. Victorino Dike.  Charmin Aguiniga S 11/13/2013, 11:13 AM

## 2013-11-13 NOTE — Progress Notes (Signed)
Clinical Child psychotherapist (CSW) received SNF placement consult. Per RN patient will D/C home. Please reconsult if further social work needs arise. CSW signing off.   Jetta Lout, LCSWA Weekend CSW [phone]

## 2013-11-13 NOTE — Anesthesia Postprocedure Evaluation (Signed)
Anesthesia Post Note  Patient: Cameron Mcneil  Procedure(s) Performed: Procedure(s) (LRB): OPEN REDUCTION INTERNAL FIXATION (ORIF) ANKLE FRACTURE/ irrigation and debridement of right forefoot wound  dehisence (Right)  Anesthesia type: general  Patient location: PACU  Post pain: Pain level controlled  Post assessment: Patient's Cardiovascular Status Stable  Post vital signs: Reviewed and stable  Level of consciousness: sedated  Complications: No apparent anesthesia complications

## 2013-11-13 NOTE — Evaluation (Signed)
Physical Therapy Evaluation Patient Details Name: Cameron Mcneil MRN: [id number] DOB: 1966/09/16 Today's Date: 11/13/2013 Time: 4098-1191 PT Time Calculation (min): 13 min  PT Assessment / Plan / Recommendation History of Present Illness  Open reduction and internal fixation of right ankle bimalleolar fracture, stress examination of right ankle under fluoro, irrigation and excisional debridement of right forefoot wound dehiscence, and delayed closure of right forefoot wound dehiscence   Clinical Impression  This patient presents with acute pain and decreased functional independence following the above mentioned procedure. At the time of PT eval, pt was able to perform functional mobility well with the knee scooter and crutches, however had a LOB on the stairs requiring mod assist to prevent a fall. Pt requires cues to slow down and for general safety awareness. This patient is appropriate for skilled PT interventions to address functional limitations, improve safety and independence with functional mobility, and return to PLOF.   PT Assessment  Patient needs continued PT services    Follow Up Recommendations  Outpatient PT (When appropriate according to post-op protocol)    Does the patient have the potential to tolerate intense rehabilitation      Barriers to Discharge        Equipment Recommendations  Other (comment) (Knee scooter, shower chair)    Recommendations for Other Services     Frequency Min 5X/week    Precautions / Restrictions Precautions Precautions: Fall Restrictions Weight Bearing Restrictions: Yes RLE Weight Bearing: Non weight bearing   Pertinent Vitals/Pain Pt reports 0/10 pain throughout session      Mobility  Bed Mobility Bed Mobility: Not assessed Supine to Sit: 7: Independent Sitting - Scoot to Edge of Bed: 7: Independent Details for Bed Mobility Assistance: Pt received standing with OT on knee scooter. Transfers Transfers: Sit to Stand;Stand to  Sit Sit to Stand: 6: Modified independent (Device/Increase time);From chair/3-in-1;With upper extremity assist Stand to Sit: 6: Modified independent (Device/Increase time);To chair/3-in-1;To bed Details for Transfer Assistance: Pt cued for hand placement when standing with crutches and overall safety awareness. Ambulation/Gait Ambulation/Gait Assistance: 5: Supervision Ambulation Distance (Feet): 25 Feet Assistive device: Other (Comment) (Knee walker/scooter) Ambulation/Gait Assistance Details: VC's for safety awareness regarding turns and speed.  Gait velocity: WNL on knee scooter General Gait Details: Pt requires cueing for safety and to slow down in general Stairs: Yes Stairs Assistance: 4: Min guard;3: Mod assist Stairs Assistance Details (indicate cue type and reason): Pt negotiated 5 steps ascending and descending x2, with one crutch and one railing. Pt was cued to go slow, however pt was rushing to get up stairs and had a LOB, requiring mod assist to prevent fall. Otherwise, min guard was appropriate during stair training.  Wheelchair Mobility Wheelchair Mobility: No    Exercises     PT Diagnosis:    PT Problem List: Decreased strength;Decreased range of motion;Decreased activity tolerance;Decreased balance;Decreased mobility;Decreased knowledge of use of DME;Decreased safety awareness;Pain PT Treatment Interventions: DME instruction;Gait training;Stair training;Functional mobility training;Therapeutic activities;Therapeutic exercise;Neuromuscular re-education;Patient/family education     PT Goals(Current goals can be found in the care plan section) Acute Rehab PT Goals Patient Stated Goal: go home today PT Goal Formulation: With patient Time For Goal Achievement: 11/20/13 Potential to Achieve Goals: Good  Visit Information  Last PT Received On: 11/13/13 Assistance Needed: +1 History of Present Illness: Open reduction and internal fixation of right ankle bimalleolar  fracture, stress examination of right ankle under fluoro, irrigation and excisional debridement of right forefoot wound dehiscence, and delayed closure of  right forefoot wound dehiscence        Prior Functioning  Home Living Family/patient expects to be discharged to:: Private residence Living Arrangements: Alone Available Help at Discharge: Family Type of Home: House Home Access: Stairs to enter Secretary/administrator of Steps: 5 Entrance Stairs-Rails: Right;Left;Can reach both Home Layout: Two level Alternate Level Stairs-Number of Steps: 10 Home Equipment: Walker - 4 wheels;Crutches Prior Function Level of Independence: Independent with assistive device(s) Comments: Mod I due to recent toe surgery Communication Communication: No difficulties Dominant Hand: Right    Cognition  Cognition Arousal/Alertness: Awake/alert Behavior During Therapy: WFL for tasks assessed/performed Overall Cognitive Status: Within Functional Limits for tasks assessed    Extremity/Trunk Assessment Upper Extremity Assessment Upper Extremity Assessment: Overall WFL for tasks assessed Lower Extremity Assessment Lower Extremity Assessment: Overall WFL for tasks assessed;RLE deficits/detail RLE Deficits / Details: Decreased strength and AROM consistent with procedure listed above RLE: Unable to fully assess due to immobilization Cervical / Trunk Assessment Cervical / Trunk Assessment: Normal   Balance    End of Session PT - End of Session Activity Tolerance: Patient tolerated treatment well Patient left: with call bell/phone within reach;in bed (sitting on EOB) Nurse Communication: Mobility status  GP Functional Assessment Tool Used: Clinical judgement Functional Limitation: Mobility: Walking and moving around Mobility: Walking and Moving Around Current Status ([id number]): At least 20 percent but less than 40 percent impaired, limited or restricted Mobility: Walking and Moving Around Goal Status [phone]):  At least 20 percent but less than 40 percent impaired, limited or restricted   Ruthann Cancer 11/13/2013, 11:04 AM  Ruthann Cancer, PT, DPT [phone]

## 2013-11-13 NOTE — Care Management Note (Signed)
CARE MANAGEMENT NOTE 11/13/2013  Patient:  Cameron Mcneil,Cameron Mcneil   Account Number:  [id number]  Date Initiated:  11/13/2013  Documentation initiated by:  Vance Peper  Subjective/Objective Assessment:   47 yr old male s/p ORIF of right ankle and debridement of right forefoot wound     Action/Plan:   PT/OT eval  CM spoke with patient concerning DME needs. Pateint will take script for knee walker to [facility]   Anticipated DC Date:  11/13/2013   Anticipated DC Plan:  HOME/SELF CARE  In-house referral  Clinical Social Worker      DC Planning Services  CM consult      [facility] Choice  DURABLE MEDICAL EQUIPMENT   Choice offered to / List presented to:  C-1 Patient   DME arranged  3-N-1      DME agency  [facility]     HH arranged  NA      HH agency  NA   Status of service:  Completed, signed off Discharge Disposition:  HOME/SELF CARE

## 2013-11-13 NOTE — Op Note (Signed)
NAME:  Cameron Mcneil, Cameron Mcneil NO.:  [id number]  MEDICAL RECORD NO.:  [id number]  LOCATION:  5N01C                        FACILITY:  [facility]  PHYSICIAN:  Toni Arthurs, MD        DATE OF BIRTH:  10-Jan-1966  DATE OF PROCEDURE:  11/11/2013 DATE OF DISCHARGE:                              OPERATIVE REPORT   PREOPERATIVE DIAGNOSES: 1. Right bimalleolar ankle fracture dislocation status post closed     reduction. 2. Wound dehiscence of the right dorsal forefoot status post right     hallux metatarsophalangeal joint replacement 3 days ago.  POSTOPERATIVE DIAGNOSES: 1. Right bimalleolar ankle fracture dislocation status post closed     reduction. 2. Wound dehiscence of the right dorsal forefoot status post right     hallux metatarsophalangeal joint replacement 3 days ago.  PROCEDURE: 1. Open reduction and internal fixation of the right ankle bimalleolar     fracture. 2. Stress examination of the right ankle under fluoroscopy. 3. Irrigation and excisional debridement of the right forefoot wound     dehiscence. 4. Delayed closure of the right forefoot wound dehiscence.  SURGEON:  Toni Arthurs, MD  ASSISTANT:  Lorin Picket Flowers, PA-C  ANESTHESIA:  General.  ESTIMATED BLOOD LOSS:  Minimal.  TOURNIQUET TIME:  79 minutes at 250 mmHg.  COMPLICATIONS:  None apparent.  DISPOSITION:  Extubated, awake, and stable to recovery.  INDICATIONS FOR PROCEDURE:  The patient is a 47 year old male without significant past medical history.  He had his right hallux MP joint replaced by Dr. Al Corpus of [facility] 2 days prior to admission. He was walking at home without his boot on when he slipped on the stairs and fell injuring his right ankle.  He also noted increased bleeding at the right forefoot dressing after that injury.  He was evaluated in the emergency department last night.  I performed a closed reduction of the ankle posterior flexed with dislocation under ketamine and  propofol.  He was splinted and presents now for ORIF of his right ankle bimalleolar fracture and I and D of the wound with delayed closure.  He has been on IV antibiotics for 24 hours.  He understands the risks and benefits, the alternative treatment options, and elects surgical treatment.  He specifically understands risks of bleeding, infection, nerve damage, blood clots, need for additional surgery, amputation, and death.  This is a planned return to the operating room for definitive treatment of this ankle fracture dislocation.  PROCEDURE IN DETAIL:  After preoperative consent was obtained and the correct operative site was identified, the patient was brought to the operating room and placed supine on the operating table.  General anesthesia was induced.  Preoperative antibiotics were administered. Surgical time-out was taken.  The patient was then turned into the lateral decubitus position on a bean bag with the right side up.  The right lower extremity was prepped and draped in standard sterile fashion with the tourniquet around the thigh.  The extremity was exsanguinated. The tourniquet was inflated to 250 mmHg.  A curvilinear incision was made in the interval between the Achilles and the peroneals at the posterolateral aspect of the right ankle.  Care  was taken to protect branches of sural nerve.  Sharp dissection was carried down through skin and subcutaneous tissue.  The interval between the peroneals and the flexor hallucis longus tendon was developed and the posterior aspect of the tibia was identified.  The hematoma was all irrigated copiously. The fracture was reduced and provisionally pinned.  AP and lateral fluoroscopic images confirmed appropriate reduction of the fracture.  A 4-mm partially threaded cannulated screw was then inserted across the fracture site.  It was noted to have excellent purchase.  Attention was then turned to the fibula.  Blunt dissection was  carried anterior to the peroneals and the fracture site was identified.  There was significant comminution noted.  Fracture was irrigated of all hematoma.  The fracture was reduced and held with 2 lobster claws and a tenaculum.  AP and lateral fluoroscopic images confirmed appropriate reduction of the fracture.  The lag screw was then inserted from anterior to posterior across the proximal extent of the oblique fracture line.  This is noted to have excellent purchase.  The 2nd lag screw was then inserted from posterior to anterior securing one of the large comminuted fragments from the anterior portion of the distal fibula.  A Biomet ALPS composite plate was then selected and provisionally secured with K-wires.  Fluoroscopic imaging confirmed appropriate position of the plate.  It was secured proximally in the oblong hole with a 3.5-mm fully-threaded cortical screw.  Distally it was secured with a nonlocking unicortical screw pulling the plate securely down to the bone.  Two more locking screws were inserted in the distal fragment. Two more nonlocking screws were inserted in bicortical fashion in the proximal fragments.  The nonlocking screw was removed from the distal end of the plate and moved to the proximal end of the plate and inserted in bicortical fashion.  This screw was replaced with a unicortical screw.  Final AP, lateral, and mortise radiographs of the ankle revealed appropriate reduction of the fibular fracture and appropriate position and length of all hardware.  A mortise view was then obtained.  Dorsiflexion and external rotation stress was applied under live fluoroscopy.  No evidence of widening of the ankle mortise was noted.  The posterior wound was irrigated copiously.  Inverted simple sutures of 3-0 Monocryl were used to close the subcutaneous tissue and a running 3- 0 nylon was used to close the skin incision.  The dorsal foot wound was then examined.  There was  dehiscence of the skin and subcutaneous layers, but the capsular repair was noted to still be intact.  There was no exposed implant noted.  The wound was then excisionally debrided with scissors and forceps.  This was done circumferentially from the level of the skin down to the level of the subcutaneous tissue and the dorsal joint capsule.  The wound was then curetted and then irrigated copiously with 2 L of normal saline.  The incision was then repaired with horizontal mattress sutures of 3-0 nylon.  Exparel was mixed one-to-one with normal saline and a total of 40 mL was infiltrated into the subcutaneous tissue about the posterior skin incision.  Sterile dressings were then applied followed by a well- padded short-leg splint.  Tourniquet was released at 79 minutes after application of the dressings.  The patient was awakened from anesthesia and transported to the recovery room in stable condition.  FOLLOWUP PLAN:  The patient will be nonweightbearing on the right lower extremity.  He will have physical therapy consultation tomorrow  and we will plan to discharge him home at that time.  He will continue on IV antibiotics for another 24 hours until the time of discharge.  Scott Flowers, PA-C was present and scrubbed for the duration of the case.  His assistance was critical in gaining and maintaining exposure, performing the operation, closing the wounds, and applying the dressings and a splint.     Toni Arthurs, MD     JH/MEDQ  D:  11/12/2013  T:  11/13/2013  Job:  [id number]

## 2013-11-13 NOTE — Evaluation (Signed)
Occupational Therapy Evaluation and Discharge Patient Details Name: Cameron Mcneil MRN: [id number] DOB: [birth year] Today's Date: 11/13/2013 Time: 4098-1191 OT Time Calculation (min): 23 min  OT Assessment / Plan / Recommendation History of present illness Open reduction and internal fixation of right ankle bimalleolar fracture, stress examination of right ankle under fluoro, irrigation and excisional debridement of right forefoot wound dehiscence, and delayed closure of right forefoot wound dehiscence    Clinical Impression   This 47 yo male admitted and underwent above presents to acute OT with all education completed, will D/C from acute OT.    OT Assessment  Patient does not need any further OT services    Follow Up Recommendations  No OT follow up       Equipment Recommendations  3 in 1 bedside comode (knee walker)          Precautions / Restrictions Precautions Precautions: Fall Restrictions Weight Bearing Restrictions: Yes RLE Weight Bearing: Non weight bearing   Pertinent Vitals/Pain  No c/o    ADL  Equipment Used: Rolling walker;Back brace (knee walker) Transfers/Ambulation Related to ADLs: Mod I with RW and Knee walker ADL Comments: Mod I for all     Acute Rehab OT Goals Patient Stated Goal: go home today OT Goal Formulation: With patient  Visit Information  Last OT Received On: 11/13/13 Assistance Needed: +1 History of Present Illness: Open reduction and internal fixation of right ankle bimalleolar fracture, stress examination of right ankle under fluoro, irrigation and excisional debridement of right forefoot wound dehiscence, and delayed closure of right forefoot wound dehiscence        Prior Functioning     Home Living Family/patient expects to be discharged to:: Private residence Living Arrangements: Alone Type of Home: House Home Access: Stairs to enter Secretary/administrator of Steps: 5 Home Layout: Two level Alternate Level Stairs-Number  of Steps: 10 Home Equipment: Walker - 4 wheels;Crutches Prior Function Comments: Mod I due to recent toe surgery Communication Communication: No difficulties Dominant Hand: Right         Vision/Perception Vision - History Patient Visual Report: No change from baseline   Cognition  Cognition Arousal/Alertness: Awake/alert Behavior During Therapy: WFL for tasks assessed/performed Overall Cognitive Status: Within Functional Limits for tasks assessed    Extremity/Trunk Assessment Upper Extremity Assessment Upper Extremity Assessment: Overall WFL for tasks assessed     Mobility Bed Mobility Bed Mobility: Supine to Sit;Sitting - Scoot to Edge of Bed Supine to Sit: 7: Independent Sitting - Scoot to Edge of Bed: 7: Independent Transfers Transfers: Sit to Stand;Stand to Sit Sit to Stand: 6: Modified independent (Device/Increase time);With upper extremity assist Stand to Sit: 6: Modified independent (Device/Increase time);With upper extremity assist           End of Session OT - End of Session Equipment Utilized During Treatment: Rolling walker (knee walker) Activity Tolerance: Patient tolerated treatment well Patient left:  (with PT in gym doing stair training)  GO Functional Assessment Tool Used: clinical observation Functional Limitation: Self care Self Care Current Status (Y7829): 0 percent impaired, limited or restricted Self Care Goal Status (F6213): 0 percent impaired, limited or restricted Self Care Discharge Status [phone]): 0 percent impaired, limited or restricted   Evette Georges [phone] 11/13/2013, 10:38 AM

## 2013-11-17 ENCOUNTER — Encounter (HOSPITAL_COMMUNITY): Payer: Self-pay | Admitting: Orthopedic Surgery

## 2013-11-17 NOTE — Discharge Summary (Signed)
Physician Discharge Summary  Patient ID: Cameron Mcneil MRN: [id number] DOB/AGE: Aug 17, 1966 47 y.o.  Admit date: 11/11/2013 Discharge date: 11/17/2013  Admission Diagnoses: Right bimalleolar ankle fracture  Discharge Diagnoses: Same Active Problems:   Ankle fracture, bimalleolar, closed   Discharged Condition: good  Hospital Course: Pt brought to ED 11/11/2013 for right ankle pain found to have bimalleolar ankle fracture with posterior dislocation.  Closed reduction performed by Dr. Victorino Dike in ED.  Pt admitted and scheduled for ORIF of right ankle.  All risks and benefits of surgery were discussed with pt and he decided to proceed with surgical correction of ankle fracture.  On 11/14/2013 pt was brought to OR for ORIF of right ankle fracture.  Surgery was performed by Dr. Toni Arthurs with no complications and pt was recovered in the PACU and then transferred to the floor for further post operative care.  During his stay pt received antibiotics and anticoagulation medication.  PT eval found pt to be independent and appropriate for discharge POD #1.  On 11/12/2013 all vital signs were stable and pt was discharged home.  Consults: None  Significant Diagnostic Studies: none  Treatments: surgery: ORIF right bimalleolar fracture  Discharge Exam: Blood pressure 138/85, pulse 103, temperature 97.3 F (36.3 C), temperature source Oral, resp. rate 18, height 6\' 2"  (1.88 m), weight 117.935 kg (260 lb), SpO2 94.00%. WD WN male NAD, A/Ox3, appears stated age.  EOMI, mood and affect normal, respirations unlabored.  Splint in proper placement and good repair.  Toes mobile, well perfused, cap refill <2sec.  Normal sensation to light touch intact distally.  Disposition: 01-Home or Self Care  Discharge Orders   Future Orders Complete By Expires   Call MD / Call 911  As directed    Comments:     If you experience chest pain or shortness of breath, CALL 911 and be transported to the hospital emergency  room.  If you develope a fever above 101 F, pus (white drainage) or increased drainage or redness at the wound, or calf pain, call your surgeon's office.   Constipation Prevention  As directed    Comments:     Drink plenty of fluids.  Prune juice may be helpful.  You may use a stool softener, such as Colace (over the counter) 100 mg twice a day.  Use MiraLax (over the counter) for constipation as needed.   Diet - low sodium heart healthy  As directed    DME Other see comment  As directed    Comments:     Rolling knee walker   Driving restrictions  As directed    Comments:     No driving for 6 weeks   Increase activity slowly as tolerated  As directed    Lifting restrictions  As directed    Comments:     No lifting for 6 weeks       Medication List         cephALEXin 500 MG capsule  Commonly known as:  KEFLEX  Take 1 capsule (500 mg total) by mouth 3 (three) times daily.     oxyCODONE 5 MG immediate release tablet  Commonly known as:  ROXICODONE  Take 2 tablets (10 mg total) by mouth every 4 (four) hours as needed for moderate pain or severe pain.     oxyCODONE-acetaminophen 10-325 MG per tablet  Commonly known as:  PERCOCET  Take one to two tablets by mouth every six to eight hours as needed for pain.  promethazine 12.5 MG tablet  Commonly known as:  PHENERGAN  Take 2 tablets (25 mg total) by mouth every 6 (six) hours as needed for nausea or vomiting.           Follow-up Information   Follow up with Toni Arthurs, MD. Call in 2 weeks.   Specialty:  Orthopedic Surgery   Contact information:   [address] [place] [address] [phone]       Signed: Hartford Poli 11/17/2013, 7:35 AM

## 2013-11-18 ENCOUNTER — Other Ambulatory Visit: Payer: Self-pay | Admitting: Podiatry

## 2013-12-07 NOTE — Progress Notes (Signed)
1) Keller bunion implant right foot

## 2013-12-23 ENCOUNTER — Other Ambulatory Visit: Payer: Self-pay | Admitting: Podiatry

## 2013-12-24 ENCOUNTER — Telehealth: Payer: Self-pay | Admitting: *Deleted

## 2013-12-24 NOTE — Telephone Encounter (Signed)
Scheduled pt for Tuesday 12-29-13 with Dr. Milinda Pointer

## 2013-12-24 NOTE — Telephone Encounter (Signed)
Refill request okayed by Dr Milinda Pointer for Cephalexin, ordered to be seen at 1st available appt.

## 2013-12-29 ENCOUNTER — Ambulatory Visit (INDEPENDENT_AMBULATORY_CARE_PROVIDER_SITE_OTHER): Payer: BC Managed Care – PPO | Admitting: Podiatry

## 2013-12-29 ENCOUNTER — Ambulatory Visit (INDEPENDENT_AMBULATORY_CARE_PROVIDER_SITE_OTHER): Payer: BC Managed Care – PPO

## 2013-12-29 VITALS — BP 159/110 | HR 81 | Resp 16

## 2013-12-29 DIAGNOSIS — Z9889 Other specified postprocedural states: Secondary | ICD-10-CM

## 2013-12-29 NOTE — Progress Notes (Signed)
Dos, 12.12.14 first post op since surgery due to breaking ankle after surgery, feels numb and tingly , swelling .  Objective: Vital signs are stable he is alert and oriented x3. Right lower extremity is edematous and swollen with a negative Homans sign. Pulses are palpable right lower extremity. Pitting edema to the dorsal aspect of the right foot. Status post fracture ankle right he has a Chief Technology Officer arthroplasty with single silicone implant has a good range of motion of the first metatarsophalangeal joint. Radiographic evaluation confirms good alignment of the first metatarsophalangeal joint however he has a plate with screws to the fibula.  Assessment: Well-healing surgical first metatarsophalangeal joint status post Keller arthroplasty single silicone implant 73/42/8768. Mild limitation on plantar flexion. Mild hallux malleus.  Plan: Do to his ankle fracture he will be receiving physical therapy in the near future he was also instructed to have physical therapy on the first metatarsophalangeal joint and PIPJ. I will followup with him in one month for another set of x-rays.

## 2014-03-09 ENCOUNTER — Ambulatory Visit (INDEPENDENT_AMBULATORY_CARE_PROVIDER_SITE_OTHER): Payer: BC Managed Care – PPO | Admitting: Podiatry

## 2014-03-09 ENCOUNTER — Encounter: Payer: Self-pay | Admitting: Podiatry

## 2014-03-09 ENCOUNTER — Ambulatory Visit (INDEPENDENT_AMBULATORY_CARE_PROVIDER_SITE_OTHER): Payer: BC Managed Care – PPO

## 2014-03-09 VITALS — BP 146/94 | HR 76 | Resp 12

## 2014-03-09 DIAGNOSIS — Z9889 Other specified postprocedural states: Secondary | ICD-10-CM

## 2014-03-09 MED ORDER — METHYLPREDNISOLONE (PAK) 4 MG PO TABS: ORAL_TABLET | ORAL | Status: DC

## 2014-03-10 NOTE — Progress Notes (Signed)
He presents today for discomfort around the first metatarsophalangeal joint where he had a Keller arthroplasty performed. He states his fractured ankle and foot have gone to heal fine very little pain with that. However he still having issues regarding his right great toe.  Objective: Vital signs are stable he is alert and oriented x3 pulses are palpable right lower extremity. Evaluation of the right lower extremity demonstrates good range of motion of the first metatarsophalangeal joint however it will not purchase the ground without plantar flexion I joint. IP joint is somewhat rigid in nature and was not this way prior to surgery. However it was this way the operating room either. After physical examination and radiographic examination it appears that he has a hallux malleus possibly associated with a loss of the flexor brevis tendons and a rigidity of the IP joint is possibly associated with a fracture that was not noticed at the time of his ankle fracture. I also found on radiograph an area of the talar that appeared to be fractured as well. He still has considerable swelling about the first metatarsophalangeal joint is visible on radiograph as well.  Assessment: Rigid hallux malleus associated with first metatarsophalangeal joint implant possible rupture of the flexor hallucis brevis tendons possible fracture of the IP joint.  Plan: We discussed the etiology pathology conservative versus surgical therapies discussed the possible need for surgical correction consisting of an IPJ fusion we will have to be performed with staples rather than screw do to his implant. He understands this and is amenable to it if necessary. However his continue physical therapy and I provided him with a Sterapred Dosepak to help decrease the inflammation in the area which will hopefully allow more mobilization of the toe. I will followup with him in a month to 6 weeks.

## 2014-05-10 ENCOUNTER — Other Ambulatory Visit: Payer: Self-pay | Admitting: Podiatry

## 2015-08-23 ENCOUNTER — Encounter: Payer: Self-pay | Admitting: Family Medicine

## 2015-08-23 ENCOUNTER — Ambulatory Visit (INDEPENDENT_AMBULATORY_CARE_PROVIDER_SITE_OTHER): Payer: BLUE CROSS/BLUE SHIELD | Admitting: Family Medicine

## 2015-08-23 VITALS — BP 136/72 | HR 70 | Temp 98.2°F | Wt 273.0 lb

## 2015-08-23 DIAGNOSIS — E785 Hyperlipidemia, unspecified: Secondary | ICD-10-CM

## 2015-08-23 DIAGNOSIS — Z7189 Other specified counseling: Secondary | ICD-10-CM

## 2015-08-23 DIAGNOSIS — E291 Testicular hypofunction: Secondary | ICD-10-CM | POA: Diagnosis not present

## 2015-08-23 DIAGNOSIS — E669 Obesity, unspecified: Secondary | ICD-10-CM | POA: Diagnosis not present

## 2015-08-23 NOTE — Progress Notes (Signed)
Pre visit review using our clinic review tool, if applicable. No additional management support is needed unless otherwise documented below in the visit note.  H/o HLD, labs reviewed.  Weight up after dec in exercise from ankle injury.  D/w pt about diet, not skipping meals, gradual return to exercise.  Due for f/u labs.    Tetanus done ~7 years ago per patient report.    H/o low T.  Fatigue, weight gain noted.  D/w pt about path/phys, esp re: need to dec weight to help with condition.  D/w pt about f/u labs.  He'll return for labs.    Advance care planning.  If patient incapacitated, then Richardean Sale designated.    PMH and SH reviewed  ROS: See HPI, otherwise noncontributory.  Meds, vitals, and allergies reviewed.   GEN: nad, alert and oriented HEENT: mucous membranes moist NECK: supple w/o LA CV: rrr PULM: ctab, no inc wob ABD: soft, +bs EXT: no edema

## 2015-08-23 NOTE — Patient Instructions (Signed)
Schedule a lab visit on the way out.  We'll contact you with your lab report. Take care.  Glad to see you.  I would get a flu shot each fall.

## 2015-08-24 ENCOUNTER — Encounter: Payer: Self-pay | Admitting: Family Medicine

## 2015-08-24 DIAGNOSIS — Z7189 Other specified counseling: Secondary | ICD-10-CM | POA: Insufficient documentation

## 2015-08-24 DIAGNOSIS — E785 Hyperlipidemia, unspecified: Secondary | ICD-10-CM | POA: Insufficient documentation

## 2015-08-24 NOTE — Assessment & Plan Note (Signed)
D/w pt about weight diet and exercise.

## 2015-08-24 NOTE — Assessment & Plan Note (Addendum)
Return for labs. Flu shot encouraged.  D/w pt about diet and exercise.  >25 minutes spent in face to face time with patient, >50% spent in counselling or coordination of care.

## 2015-08-24 NOTE — Assessment & Plan Note (Signed)
Return for f/u labs.  We'll address when I see his labs.

## 2015-08-31 ENCOUNTER — Other Ambulatory Visit (INDEPENDENT_AMBULATORY_CARE_PROVIDER_SITE_OTHER): Payer: BLUE CROSS/BLUE SHIELD

## 2015-08-31 DIAGNOSIS — E291 Testicular hypofunction: Secondary | ICD-10-CM | POA: Diagnosis not present

## 2015-08-31 DIAGNOSIS — E785 Hyperlipidemia, unspecified: Secondary | ICD-10-CM | POA: Diagnosis not present

## 2015-08-31 LAB — LIPID PANEL
CHOLESTEROL: 167 mg/dL (ref 0–200)
HDL: 46 mg/dL (ref >39.00)
LDL CALC: 83 mg/dL (ref 0–99)
NonHDL: 120.67
Total CHOL/HDL Ratio: 4
Triglycerides: 189 mg/dL — ABNORMAL HIGH (ref 0.0–149.0)
VLDL: 37.8 mg/dL (ref 0.0–40.0)

## 2015-08-31 LAB — BASIC METABOLIC PANEL
BUN: 11 mg/dL (ref 6–23)
CHLORIDE: 101 meq/L (ref 96–112)
CO2: 30 mEq/L (ref 19–32)
CREATININE: 1.19 mg/dL (ref 0.40–1.50)
Calcium: 9.8 mg/dL (ref 8.4–10.5)
GFR: 84.22 mL/min (ref >60.00)
Glucose, Bld: 116 mg/dL — ABNORMAL HIGH (ref 70–99)
Potassium: 4.3 mEq/L (ref 3.5–5.1)
Sodium: 138 mEq/L (ref 135–145)

## 2015-08-31 LAB — TESTOSTERONE: TESTOSTERONE: 250.75 ng/dL — AB (ref 300.00–890.00)

## 2015-09-07 ENCOUNTER — Other Ambulatory Visit: Payer: Self-pay | Admitting: Family Medicine

## 2015-09-07 DIAGNOSIS — E291 Testicular hypofunction: Secondary | ICD-10-CM

## 2015-10-19 ENCOUNTER — Encounter: Payer: Self-pay | Admitting: Family Medicine

## 2015-10-19 ENCOUNTER — Ambulatory Visit (INDEPENDENT_AMBULATORY_CARE_PROVIDER_SITE_OTHER): Payer: BLUE CROSS/BLUE SHIELD | Admitting: Family Medicine

## 2015-10-19 VITALS — BP 148/104 | HR 76 | Temp 97.6°F | Wt 270.8 lb

## 2015-10-19 DIAGNOSIS — J069 Acute upper respiratory infection, unspecified: Secondary | ICD-10-CM | POA: Diagnosis not present

## 2015-10-19 NOTE — Progress Notes (Signed)
Pre visit review using our clinic review tool, if applicable. No additional management support is needed unless otherwise documented below in the visit note. 

## 2015-10-19 NOTE — Progress Notes (Signed)
SUBJECTIVE:  Cameron Mcneil is a 49 y.o. male of Dr. Damita Dunnings who complains of coryza, congestion, sneezing and sore throat for 5 days. He denies a history of anorexia, chest pain, chills and dizziness and denies a history of asthma. Patient denies smoke cigarettes.   No current outpatient prescriptions on file prior to visit.   No current facility-administered medications on file prior to visit.    No Known Allergies  Past Medical History  Diagnosis Date  . Allergic rhinitis   . Hypogonadism male   . ED (erectile dysfunction)   . Hyperlipidemia     Past Surgical History  Procedure Laterality Date  . Toe surgery Right 11/06/2013    MPJ     HALLUX           DR  HYATT  . Orif ankle fracture Right 11/12/2013    Procedure: OPEN REDUCTION INTERNAL FIXATION (ORIF) ANKLE FRACTURE/ irrigation and debridement of right forefoot wound  dehisence;  Surgeon: Wylene Simmer, MD;  Location: [facility];  Service: Orthopedics;  Laterality: Right;    Family History  Problem Relation Age of Onset  . Hypertension Mother   . Prostate cancer Neg Hx   . Colon cancer Neg Hx   . Diabetes Other     Social History   Social History  . Marital Status: Single    Spouse Name: N/A  . Number of Children: N/A  . Years of Education: 16   Occupational History  . 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     Social History Main Topics  . Smoking status: Never Smoker   . Smokeless tobacco: Never Used  . Alcohol Use: No  . Drug Use: No  . Sexual Activity:    Partners: Female   Other Topics Concern  . Not on file   Social History Narrative   [employer], [employer] Graduate. Single but in relationship. Work - Administrator, Therapist, sports.   2 kids   The PMH, PSH, Social History, Family History, Medications, and allergies have been reviewed in [facility], and have been updated if relevant.  OBJECTIVE: BP 148/104 mmHg  Pulse 76  Temp(Src) 97.6 F (36.4 C) (Oral)  Wt 270 lb 12 oz (122.811  kg)  SpO2 93%  He appears well, vital signs are as noted. Ears normal.  Throat and pharynx normal.  Neck supple. No adenopathy in the neck. Nose is congested. Sinuses non tender. The chest is clear, without wheezes or rales.  ASSESSMENT:  viral upper respiratory illness  PLAN: Symptomatic therapy suggested: push fluids, rest and return office visit prn if symptoms persist or worsen. Lack of antibiotic effectiveness discussed with him. Call or return to clinic prn if these symptoms worsen or fail to improve as anticipated.

## 2015-10-27 ENCOUNTER — Encounter: Payer: Self-pay | Admitting: Family Medicine

## 2015-10-27 ENCOUNTER — Ambulatory Visit (INDEPENDENT_AMBULATORY_CARE_PROVIDER_SITE_OTHER): Payer: BLUE CROSS/BLUE SHIELD | Admitting: Family Medicine

## 2015-10-27 VITALS — BP 138/94 | HR 80 | Temp 97.6°F | Wt 274.5 lb

## 2015-10-27 DIAGNOSIS — B079 Viral wart, unspecified: Secondary | ICD-10-CM | POA: Insufficient documentation

## 2015-10-27 NOTE — Progress Notes (Signed)
Pre visit review using our clinic review tool, if applicable. No additional management support is needed unless otherwise documented below in the visit note.  Low T being followed by uro.   Skin lesion on R upper back.  Not changed recently. Not ttp usually, .  Lesion on L 2nd finger, palmar side.  Present for months.   Meds, vitals, and allergies reviewed.   ROS: See HPI.  Otherwise, noncontributory.  nad Skin lesion on R upper back, soft, not ttp, could be either seb cyst vs lipoma.   Wart on L 2nd finger, palmar side

## 2015-10-27 NOTE — Assessment & Plan Note (Signed)
D/w pt.  Frozen x4 with liq N2 after discussion of options.  No complication. F/u prn.  Can retreat if needed.  See AVS.   Reassured re: lesion on back, no need for intervention.

## 2015-10-27 NOTE — Patient Instructions (Signed)
Should blister up.  Keep clean and covered.   Wash with soap and water.   Should heal over gradually.   Take care.  Glad to see you.

## 2017-01-22 DIAGNOSIS — I10 Essential (primary) hypertension: Secondary | ICD-10-CM | POA: Insufficient documentation

## 2017-01-22 DIAGNOSIS — Z91199 Patient's noncompliance with other medical treatment and regimen due to unspecified reason: Secondary | ICD-10-CM | POA: Insufficient documentation

## 2017-01-22 DIAGNOSIS — Z9119 Patient's noncompliance with other medical treatment and regimen: Secondary | ICD-10-CM | POA: Insufficient documentation

## 2017-04-05 ENCOUNTER — Telehealth: Payer: Self-pay

## 2017-04-05 MED ORDER — LOSARTAN POTASSIUM-HCTZ 100-25 MG PO TABS: 1.0000 | ORAL_TABLET | Freq: Every day | ORAL | 0 refills | Status: DC

## 2017-04-05 NOTE — Telephone Encounter (Signed)
Pt left v/m requesting refill losartan 100/25 mg to [facility] [address]. Pt established care 08/23/15; acute and last visit 10/27/15. Pt has appt scheduled for med refills on 04/12/17. Do not see losartan 100/25 mg on current or hx med list. I spoke with [facility] [address] and losartan 100/25 last filled 01/22/17 by Dr Ester Rink.Please advise.

## 2017-04-05 NOTE — Telephone Encounter (Signed)
Sent.  Thanks.  Will see at [place] scheduled.

## 2017-04-12 ENCOUNTER — Encounter: Payer: Self-pay | Admitting: Family Medicine

## 2017-04-12 ENCOUNTER — Ambulatory Visit (INDEPENDENT_AMBULATORY_CARE_PROVIDER_SITE_OTHER): Payer: BLUE CROSS/BLUE SHIELD | Admitting: Family Medicine

## 2017-04-12 VITALS — BP 138/94 | HR 68 | Temp 98.4°F | Wt 277.2 lb

## 2017-04-12 DIAGNOSIS — I10 Essential (primary) hypertension: Secondary | ICD-10-CM

## 2017-04-12 LAB — COMPREHENSIVE METABOLIC PANEL
ALBUMIN: 4.3 g/dL (ref 3.5–5.2)
ALT: 30 U/L (ref 0–53)
AST: 21 U/L (ref 0–37)
Alkaline Phosphatase: 30 U/L — ABNORMAL LOW (ref 39–117)
BUN: 13 mg/dL (ref 6–23)
CHLORIDE: 104 meq/L (ref 96–112)
CO2: 28 mEq/L (ref 19–32)
CREATININE: 1.08 mg/dL (ref 0.40–1.50)
Calcium: 9.4 mg/dL (ref 8.4–10.5)
GFR: 93.55 mL/min (ref >60.00)
Glucose, Bld: 130 mg/dL — ABNORMAL HIGH (ref 70–99)
POTASSIUM: 4 meq/L (ref 3.5–5.1)
SODIUM: 139 meq/L (ref 135–145)
Total Bilirubin: 0.4 mg/dL (ref 0.2–1.2)
Total Protein: 7.2 g/dL (ref 6.0–8.3)

## 2017-04-12 LAB — LIPID PANEL
CHOLESTEROL: 174 mg/dL (ref 0–200)
HDL: 46.4 mg/dL (ref >39.00)
NonHDL: 127.85
Total CHOL/HDL Ratio: 4
Triglycerides: 214 mg/dL — ABNORMAL HIGH (ref 0.0–149.0)
VLDL: 42.8 mg/dL — AB (ref 0.0–40.0)

## 2017-04-12 LAB — LDL CHOLESTEROL, DIRECT: Direct LDL: 87 mg/dL

## 2017-04-12 MED ORDER — LOSARTAN POTASSIUM-HCTZ 100-25 MG PO TABS: 1.0000 | ORAL_TABLET | Freq: Every day | ORAL | 3 refills | Status: DC

## 2017-04-12 NOTE — Patient Instructions (Signed)
Go to the lab on the way out.  We'll contact you with your lab report. Check your BP a few times and update me if your BP is consistently >140/>90. Take care.  Glad to see you.

## 2017-04-12 NOTE — Assessment & Plan Note (Signed)
He'll check BP out of clinic and update me as needed.  Labs today.  D/w pt about diet and exercise.  rx sent.  He agrees.

## 2017-04-12 NOTE — Progress Notes (Signed)
Hypertension:    Using medication without problems or lightheadedness: yes Chest pain with exertion:no Edema:no Short of breath:no D/w pt about diet and exercise.  Encouraged both.  Work was complicating both.  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   Due for labs.  No FCNAVD.    HIV screening prev done with life insurance ~2017 D/w pt about T replacement and he'll f/u with urology.    Advance care planning.  If patient incapacitated, then Richardean Sale designated.    Meds, vitals, and allergies reviewed.   PMH and SH reviewed  ROS: Per HPI unless specifically indicated in ROS section   GEN: nad, alert and oriented HEENT: mucous membranes moist NECK: supple w/o LA CV: rrr. PULM: ctab, no inc wob ABD: soft, +bs EXT: no edema SKIN: no acute rash

## 2017-04-13 ENCOUNTER — Other Ambulatory Visit: Payer: Self-pay | Admitting: Family Medicine

## 2017-04-13 DIAGNOSIS — R739 Hyperglycemia, unspecified: Secondary | ICD-10-CM | POA: Insufficient documentation

## 2017-05-13 ENCOUNTER — Encounter: Payer: Self-pay | Admitting: Family Medicine

## 2017-05-13 NOTE — Telephone Encounter (Signed)
Pt called to ck status of losartan HCTZ refill; advised pt resent to [facility] [address] this morning. Pt voiced understanding and will pick up this afternoon when gets off work.

## 2017-07-16 ENCOUNTER — Ambulatory Visit: Payer: BLUE CROSS/BLUE SHIELD | Admitting: Family Medicine

## 2017-07-23 ENCOUNTER — Encounter: Payer: Self-pay | Admitting: Family Medicine

## 2018-01-17 ENCOUNTER — Encounter: Payer: Self-pay | Admitting: Family Medicine

## 2018-01-17 ENCOUNTER — Ambulatory Visit (INDEPENDENT_AMBULATORY_CARE_PROVIDER_SITE_OTHER)
Admission: RE | Admit: 2018-01-17 | Discharge: 2018-01-17 | Disposition: A | Discharge status: Home / Self Care | Payer: BLUE CROSS/BLUE SHIELD | Source: Ambulatory Visit | Attending: Family Medicine | Admitting: Family Medicine

## 2018-01-17 ENCOUNTER — Ambulatory Visit: Payer: BLUE CROSS/BLUE SHIELD | Admitting: Family Medicine

## 2018-01-17 VITALS — BP 140/98 | HR 80 | Temp 98.3°F | Wt 266.5 lb

## 2018-01-17 DIAGNOSIS — R05 Cough: Secondary | ICD-10-CM

## 2018-01-17 DIAGNOSIS — R059 Cough, unspecified: Secondary | ICD-10-CM

## 2018-01-17 LAB — POC INFLUENZA A&B (BINAX/QUICKVUE)
INFLUENZA A, POC: NEGATIVE
Influenza B, POC: NEGATIVE

## 2018-01-17 MED ORDER — ALBUTEROL SULFATE HFA 108 (90 BASE) MCG/ACT IN AERS: 1.0000 | INHALATION_SPRAY | Freq: Four times a day (QID) | RESPIRATORY_TRACT | 0 refills | Status: DC | PRN

## 2018-01-17 NOTE — Patient Instructions (Addendum)
Check your BP when you feel better and if persistently >140/>90, then let me know.   Use albuterol as needed for the cough and wheeze.   Take care.  Glad to see you.  Update me as needed.

## 2018-01-17 NOTE — Progress Notes (Signed)
Off BP meds.  See AVS.  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    He had a cold about 3 weeks ago.  That got better but didn't totally resolve with some mild residual sx.  Mult sick contacts at work.  Then 01/12/18 he started have some chills.  Now with dec in appetite.  Still drinking a lot of fluids.  Still with some chills, R ear aching.   More cough recently.  He doesn't feel awful but isn't totally well.  Some sweats and chills but no known fevers.  No aches.    Didn't have a flu shot.  D/w pt.   Meds, vitals, and allergies reviewed.   ROS: Per HPI unless specifically indicated in ROS section   GEN: nad, alert and oriented HEENT: mucous membranes moist, tm w/o erythema, nasal exam w/o erythema, clear discharge noted,  OP with cobblestoning NECK: supple w/o LA CV: rrr.   PULM: ctab, no inc wob EXT: no edema SKIN: no acute rash  Flu test neg. D/w pt at OV.  CXR d/w pt.

## 2018-01-19 DIAGNOSIS — R059 Cough, unspecified: Secondary | ICD-10-CM | POA: Insufficient documentation

## 2018-01-19 DIAGNOSIS — R05 Cough: Secondary | ICD-10-CM | POA: Insufficient documentation

## 2018-01-19 NOTE — Assessment & Plan Note (Signed)
Flu test neg. D/w pt at OV.  CXR d/w pt.   D/w pt at OV.  Still okay for outpatient follow-up.  Supportive care. Use albuterol as needed for the cough and wheeze.   Likely a non-flu viral process that should gradually resolve.  He can check BP when feeling better and if persistently >140/>90, then let me know.   He agrees.

## 2018-06-21 ENCOUNTER — Other Ambulatory Visit: Payer: Self-pay | Admitting: Family Medicine

## 2018-06-23 ENCOUNTER — Encounter: Payer: Self-pay | Admitting: *Deleted

## 2018-11-18 ENCOUNTER — Telehealth: Payer: Self-pay | Admitting: *Deleted

## 2018-11-18 MED ORDER — LOSARTAN POTASSIUM-HCTZ 100-25 MG PO TABS: 1.0000 | ORAL_TABLET | Freq: Every day | ORAL | 0 refills | Status: DC

## 2018-11-18 NOTE — Telephone Encounter (Signed)
Noted. Thanks.

## 2018-11-18 NOTE — Telephone Encounter (Signed)
I spoke with pt; pt has been out of losartan-HCTZ 100-25 for at least 2 months. Pt was having H/A and started cking BP.last few days BP averaging 160/101. On 11/17/18 BP 170/110 and 11/18/18 BP 151/99. No CP, SOB, dizziness,vision changes. Pt will schedule appt. Dr Damita Dunnings said to schedule Hypertension FU; sent in one month losartan-HCTZ 100-25 to [place]. Pt scheduled FU on 11/28/18 at 8 AM; pt will come fasting. Pt will restart losartan-HCTZ 100-25 taking one tab daily. If pt develops CP,SOB,dizziness, vision changes or H/A worsens pt will go to ED prior to appt. This is the first appt pt could come in to be seen. FYI to Dr Damita Dunnings.

## 2018-11-18 NOTE — Telephone Encounter (Addendum)
Spoke to pt who states he is needing refill of BP med. BP currently 160/110. Call transferred to [facility] for triage.

## 2018-11-28 ENCOUNTER — Ambulatory Visit: Payer: BLUE CROSS/BLUE SHIELD | Admitting: Family Medicine

## 2018-11-28 DIAGNOSIS — Z0289 Encounter for other administrative examinations: Secondary | ICD-10-CM

## 2018-12-02 ENCOUNTER — Encounter: Payer: Self-pay | Admitting: Family Medicine

## 2018-12-02 ENCOUNTER — Ambulatory Visit: Payer: BLUE CROSS/BLUE SHIELD | Admitting: Family Medicine

## 2018-12-02 VITALS — BP 126/98 | HR 76 | Temp 98.1°F | Ht 74.0 in | Wt 273.0 lb

## 2018-12-02 DIAGNOSIS — Z23 Encounter for immunization: Secondary | ICD-10-CM | POA: Diagnosis not present

## 2018-12-02 DIAGNOSIS — W540XXA Bitten by dog, initial encounter: Secondary | ICD-10-CM

## 2018-12-02 DIAGNOSIS — I10 Essential (primary) hypertension: Secondary | ICD-10-CM

## 2018-12-02 DIAGNOSIS — S6991XA Unspecified injury of right wrist, hand and finger(s), initial encounter: Secondary | ICD-10-CM

## 2018-12-02 LAB — COMPREHENSIVE METABOLIC PANEL
ALT: 26 U/L (ref 0–53)
AST: 21 U/L (ref 0–37)
Albumin: 4.5 g/dL (ref 3.5–5.2)
Alkaline Phosphatase: 36 U/L — ABNORMAL LOW (ref 39–117)
BUN: 13 mg/dL (ref 6–23)
CO2: 33 meq/L — AB (ref 19–32)
Calcium: 9.7 mg/dL (ref 8.4–10.5)
Chloride: 100 mEq/L (ref 96–112)
Creatinine, Ser: 1.15 mg/dL (ref 0.40–1.50)
GFR: 86.43 mL/min (ref >60.00)
GLUCOSE: 120 mg/dL — AB (ref 70–99)
Potassium: 4 mEq/L (ref 3.5–5.1)
SODIUM: 138 meq/L (ref 135–145)
Total Bilirubin: 0.6 mg/dL (ref 0.2–1.2)
Total Protein: 7.6 g/dL (ref 6.0–8.3)

## 2018-12-02 LAB — LIPID PANEL
Cholesterol: 166 mg/dL (ref 0–200)
HDL: 47.7 mg/dL (ref >39.00)
LDL Cholesterol: 81 mg/dL (ref 0–99)
NONHDL: 118.04
Total CHOL/HDL Ratio: 3
Triglycerides: 184 mg/dL — ABNORMAL HIGH (ref 0.0–149.0)
VLDL: 36.8 mg/dL (ref 0.0–40.0)

## 2018-12-02 MED ORDER — AMOXICILLIN-POT CLAVULANATE 875-125 MG PO TABS: 1.0000 | ORAL_TABLET | Freq: Two times a day (BID) | ORAL | 0 refills | Status: DC

## 2018-12-02 MED ORDER — LOSARTAN POTASSIUM-HCTZ 100-25 MG PO TABS: 1.0000 | ORAL_TABLET | Freq: Every day | ORAL | 3 refills | Status: DC

## 2018-12-02 NOTE — Progress Notes (Signed)
Hypertension:    Using medication without problems or lightheadedness: yes Chest pain with exertion:no Edema:no Short of breath:no Back on med.   He feels better back on medicine but ED noted on med.    He pulled his dog and another known dog apart, he was bitten in the R hand, superficially.  Both dogs vaccinated and have been acting normally otherwise.  No other injury.  No one else bitten. No h/o biting.  Cautions d/w pt.  This was about 2 days ago.  He cleaned the area. The area is clearly better in the meantime per patient report.    Meds, vitals, and allergies reviewed.   PMH and SH reviewed  ROS: Per HPI unless specifically indicated in ROS section   GEN: nad, alert and oriented HEENT: mucous membranes moist NECK: supple w/o LA CV: rrr. PULM: ctab, no inc wob ABD: soft, +bs EXT: no edema SKIN: no acute rash but 64mm linear abrasion on L thumb on the thenar area with smaller lesion on the dorsum of the thumb.  Distally neurovascularly intact.

## 2018-12-02 NOTE — Patient Instructions (Signed)
If your hand doesn't continue to improve, then start augmentin and update [place].   If your BP isn't usually <140/<90, then let me know.  Go to the lab on the way out.  We'll contact you with your lab report. Schedule a physical for about 6 months.   Take care.  Glad to see you.

## 2018-12-03 DIAGNOSIS — W540XXA Bitten by dog, initial encounter: Secondary | ICD-10-CM | POA: Insufficient documentation

## 2018-12-03 NOTE — Assessment & Plan Note (Signed)
No change in meds at this point.  Continue work on diet and exercise.  That may help with erectile dysfunction concurrently.  He may have improvement in erectile dysfunction as he adjusts to being back on his blood pressure medications.  If he continues to have trouble then he can let me know.  No chest pain.  Okay for outpatient follow-up.  Discussed routine follow-up. See after visit summary.  He agrees.

## 2018-12-03 NOTE — Assessment & Plan Note (Signed)
Superficial.  No spreading erythema.  No deep tissue injury apparent on exam.  Both dogs are vaccinated.  This seems to be an isolated event.  Routine cautions given.  Since he is already improving, then hold Augmentin for now.  If any worsening in the meantime then start Augmentin and let me know.  He agrees.

## 2018-12-04 ENCOUNTER — Encounter: Payer: Self-pay | Admitting: *Deleted

## 2018-12-09 ENCOUNTER — Telehealth: Payer: Self-pay | Admitting: *Deleted

## 2018-12-09 NOTE — Telephone Encounter (Signed)
Pharmacy states that Losartan HCTZ  Is on back order and is asking for a substitute.

## 2018-12-10 MED ORDER — LISINOPRIL 40 MG PO TABS: 40.0000 mg | ORAL_TABLET | Freq: Every day | ORAL | 3 refills | Status: DC

## 2018-12-10 MED ORDER — HYDROCHLOROTHIAZIDE 25 MG PO TABS: 25.0000 mg | ORAL_TABLET | Freq: Every day | ORAL | 3 refills | Status: DC

## 2018-12-10 NOTE — Telephone Encounter (Signed)
Prescription sent for lisinopril and also for hydrochlorothiazide.  Take 1 pill of each together.  That should be a reasonable substitution.  The hydrochlorothiazide dose is the same.  The lisinopril is a substitution for the losartan.  40 mg of lisinopril should be equivalent to 100 mg of losartan. Have him check his blood pressure and update me if he is lightheaded or if his blood pressures consistently >140/>90.  Thanks.

## 2018-12-10 NOTE — Addendum Note (Signed)
Addended by: Tonia Ghent on: 12/10/2018 05:53 PM   Modules accepted: Orders

## 2018-12-11 NOTE — Telephone Encounter (Signed)
[facility] [address] [facility] the original prescription and he wants to stay on that medication because his BP is so well controlled on it.  Patient will have the original prescription transferred.

## 2018-12-19 ENCOUNTER — Other Ambulatory Visit: Payer: Self-pay | Admitting: *Deleted

## 2018-12-19 NOTE — Telephone Encounter (Signed)
Back order or recall on Losartan/HCTZ combo.  Patient does not want to take Lisinopril because his BP has been so good and he feels good on the Losartan/HCTZ.  Patient prefers to take the two medication individually as an alternative to the combo.  Meds pended for your approval.

## 2018-12-21 MED ORDER — LOSARTAN POTASSIUM 100 MG PO TABS: 100.0000 mg | ORAL_TABLET | Freq: Every day | ORAL | 3 refills | Status: DC

## 2018-12-21 MED ORDER — HYDROCHLOROTHIAZIDE 25 MG PO TABS: 25.0000 mg | ORAL_TABLET | Freq: Every day | ORAL | 3 refills | Status: DC

## 2018-12-21 NOTE — Telephone Encounter (Signed)
Sent. Thanks.   

## 2019-02-24 ENCOUNTER — Encounter: Payer: Self-pay | Admitting: Family Medicine

## 2019-02-24 MED ORDER — LOSARTAN POTASSIUM 100 MG PO TABS: 100.0000 mg | ORAL_TABLET | Freq: Every day | ORAL | 1 refills | Status: DC

## 2019-02-24 NOTE — Telephone Encounter (Signed)
E-scribed losartan refeill, #90/[facility] to [facility] [place] per pt request. Notified pt via MyChart.

## 2019-03-17 ENCOUNTER — Other Ambulatory Visit: Payer: Self-pay | Admitting: Family Medicine

## 2019-05-27 ENCOUNTER — Other Ambulatory Visit (INDEPENDENT_AMBULATORY_CARE_PROVIDER_SITE_OTHER): Payer: BLUE CROSS/BLUE SHIELD

## 2019-05-27 ENCOUNTER — Other Ambulatory Visit: Payer: Self-pay | Admitting: Family Medicine

## 2019-05-27 ENCOUNTER — Other Ambulatory Visit: Payer: Self-pay

## 2019-05-27 DIAGNOSIS — I10 Essential (primary) hypertension: Secondary | ICD-10-CM

## 2019-05-27 DIAGNOSIS — R739 Hyperglycemia, unspecified: Secondary | ICD-10-CM | POA: Diagnosis not present

## 2019-05-27 DIAGNOSIS — Z125 Encounter for screening for malignant neoplasm of prostate: Secondary | ICD-10-CM | POA: Diagnosis not present

## 2019-05-27 LAB — LIPID PANEL
Cholesterol: 191 mg/dL (ref 0–200)
HDL: 49.6 mg/dL (ref >39.00)
NonHDL: 141.01
Total CHOL/HDL Ratio: 4
Triglycerides: 231 mg/dL — ABNORMAL HIGH (ref 0.0–149.0)
VLDL: 46.2 mg/dL — ABNORMAL HIGH (ref 0.0–40.0)

## 2019-05-27 LAB — COMPREHENSIVE METABOLIC PANEL
ALT: 37 U/L (ref 0–53)
AST: 25 U/L (ref 0–37)
Albumin: 4.5 g/dL (ref 3.5–5.2)
Alkaline Phosphatase: 32 U/L — ABNORMAL LOW (ref 39–117)
BUN: 13 mg/dL (ref 6–23)
CO2: 30 mEq/L (ref 19–32)
Calcium: 9.6 mg/dL (ref 8.4–10.5)
Chloride: 101 mEq/L (ref 96–112)
Creatinine, Ser: 1.2 mg/dL (ref 0.40–1.50)
GFR: 77.27 mL/min (ref >60.00)
Glucose, Bld: 128 mg/dL — ABNORMAL HIGH (ref 70–99)
Potassium: 4.4 mEq/L (ref 3.5–5.1)
Sodium: 139 mEq/L (ref 135–145)
Total Bilirubin: 0.6 mg/dL (ref 0.2–1.2)
Total Protein: 7.3 g/dL (ref 6.0–8.3)

## 2019-05-27 LAB — HEMOGLOBIN A1C: Hgb A1c MFr Bld: 6.8 % — ABNORMAL HIGH (ref 4.6–6.5)

## 2019-05-27 LAB — PSA: PSA: 0.51 ng/mL (ref 0.10–4.00)

## 2019-05-27 LAB — LDL CHOLESTEROL, DIRECT: Direct LDL: 106 mg/dL

## 2019-06-02 ENCOUNTER — Encounter: Payer: BLUE CROSS/BLUE SHIELD | Admitting: Family Medicine

## 2019-06-09 ENCOUNTER — Encounter: Payer: BLUE CROSS/BLUE SHIELD | Admitting: Family Medicine

## 2019-06-23 ENCOUNTER — Encounter: Payer: Self-pay | Admitting: Family Medicine

## 2019-06-23 ENCOUNTER — Ambulatory Visit (INDEPENDENT_AMBULATORY_CARE_PROVIDER_SITE_OTHER): Payer: BLUE CROSS/BLUE SHIELD | Admitting: Family Medicine

## 2019-06-23 DIAGNOSIS — Z0000 Encounter for general adult medical examination without abnormal findings: Secondary | ICD-10-CM | POA: Diagnosis not present

## 2019-06-23 DIAGNOSIS — E119 Type 2 diabetes mellitus without complications: Secondary | ICD-10-CM

## 2019-06-23 DIAGNOSIS — R5383 Other fatigue: Secondary | ICD-10-CM | POA: Diagnosis not present

## 2019-06-23 DIAGNOSIS — I10 Essential (primary) hypertension: Secondary | ICD-10-CM

## 2019-06-23 DIAGNOSIS — Z7189 Other specified counseling: Secondary | ICD-10-CM

## 2019-06-23 MED ORDER — LOSARTAN POTASSIUM-HCTZ 100-25 MG PO TABS: 1.0000 | ORAL_TABLET | Freq: Every day | ORAL | 3 refills | Status: DC

## 2019-06-23 NOTE — Progress Notes (Signed)
Virtual visit completed through WebEx or similar program Patient location: home  Provider location: Financial controller at [facility] [place], office   Pandemic considerations d/w pt.   Limitations and rationale for visit method d/w patient.  Patient agreed to proceed.   CC: CPE.    HPI:  Tetanus 2020 Flu d/w pt.  PNA d/w pt.  Shingles due at 47.  Living will d/w pt.  If patient incapacitated, then Richardean Sale designated.   Diet and exercise d/w pt.  D/w patient [id number] for colon cancer screening, including IFOB vs. colonoscopy.  Risks and benefits of both were discussed and patient voiced understanding.  Pt elects to check on cologuard coverage and let me know.  PSA wnl.  D/w pt.   He was asking about fatigue w/u.  He was asking about getting his B12 level checked.  He has multiple reasons to be fatigued, including diabetes, job stressors, pandemic, etc.  Diabetes:  New dx, d/w pt.   Pathophysiology discussed with patient.  Discussed low-carb diet. Labs d/w pt. He is cutting back on carbs.    Hypertension:    Using medication without problems or lightheadedness: yes Chest pain with exertion:no Edema:no Short of breath:no Labs d/w pt.   BP checked at home.  Usually ~130s/80s.  Meds and allergies reviewed.   ROS: Per HPI unless specifically indicated in ROS section   NAD Speech wnl  A/P:  Tetanus 2020 Flu d/w pt.  PNA d/w pt. defer at this point.  If he is no longer diabetic with improvement in diet and exercise he may not need pneumonia vaccine in the near future. Shingles due at 89.  Living will d/w pt.  If patient incapacitated, then Richardean Sale designated.   Diet and exercise d/w pt.  D/w patient [id number] for colon cancer screening, including IFOB vs. colonoscopy.  Risks and benefits of both were discussed and patient voiced understanding.  Pt elects to check on cologuard coverage and let me know.  PSA wnl.  D/w pt.   Diabetes.  New diagnosis. Pathophysiology  discussed with patient.  Discussed low-carb diet. Labs d/w pt. He is cutting back on carbs.  Need labs visit in about 3 months to check lipids B12 and A1c.    Hypertension.  No change in meds at this point.  Continue as is.  He agrees.  Labs discussed with patient.

## 2019-06-24 DIAGNOSIS — E1129 Type 2 diabetes mellitus with other diabetic kidney complication: Secondary | ICD-10-CM | POA: Insufficient documentation

## 2019-06-24 DIAGNOSIS — E119 Type 2 diabetes mellitus without complications: Secondary | ICD-10-CM | POA: Insufficient documentation

## 2019-06-24 DIAGNOSIS — Z0000 Encounter for general adult medical examination without abnormal findings: Secondary | ICD-10-CM | POA: Insufficient documentation

## 2019-06-24 NOTE — Assessment & Plan Note (Signed)
Tetanus 2020 Flu d/w pt.  PNA d/w pt.  Shingles due at 17.  Living will d/w pt.  If patient incapacitated, then Cameron Mcneil designated.   Diet and exercise d/w pt.  D/w patient [id number] for colon cancer screening, including IFOB vs. colonoscopy.  Risks and benefits of both were discussed and patient voiced understanding.  Pt elects to check on cologuard coverage and let me know.  PSA wnl.  D/w pt.

## 2019-06-24 NOTE — Assessment & Plan Note (Signed)
Living will d/w pt.  If patient incapacitated, then Richardean Sale designated.

## 2019-06-24 NOTE — Assessment & Plan Note (Signed)
No change in meds at this point.  Continue as is.  He agrees.  Labs discussed with patient.

## 2019-06-24 NOTE — Assessment & Plan Note (Signed)
New diagnosis. Pathophysiology discussed with patient.  Discussed low-carb diet.  Discussed exercise. Labs d/w pt. He is cutting back on carbs.  Need labs visit in about 3 months to check lipids B12 and A1c.  Does not need medication at this point.  We will send him a copy of the eat right/low carbohydrate diet.

## 2019-09-10 ENCOUNTER — Other Ambulatory Visit: Payer: BLUE CROSS/BLUE SHIELD

## 2019-09-29 ENCOUNTER — Telehealth: Payer: Self-pay

## 2019-09-29 NOTE — Telephone Encounter (Signed)
LVM w COVID screen, back lab and front door

## 2019-09-30 ENCOUNTER — Other Ambulatory Visit: Payer: BLUE CROSS/BLUE SHIELD

## 2020-05-26 ENCOUNTER — Encounter: Payer: Self-pay | Admitting: Family Medicine

## 2020-05-26 ENCOUNTER — Other Ambulatory Visit: Payer: Self-pay

## 2020-05-26 DIAGNOSIS — Z0289 Encounter for other administrative examinations: Secondary | ICD-10-CM

## 2020-06-03 ENCOUNTER — Encounter: Payer: Self-pay | Admitting: Family Medicine

## 2020-06-03 ENCOUNTER — Telehealth: Payer: Self-pay

## 2020-06-03 NOTE — Telephone Encounter (Signed)
[place] Night - Client Nonclinical Telephone Record AccessNurse Client [place] Night - Client Client Site [facility] [place] Physician Renford Dills - MD Contact Type Call Who Is Calling Patient / Member / Family / Caregiver Caller Name [name] Caller Phone Number [phone] Patient Name Cameron Mcneil Patient DOB [id number] Call Type Message Only Information Provided Reason for Call Request to Schedule Office Appointment Initial Comment Caller reports he has an appointment at 0830 and would like to reschedule. Additional Comment Caller given office hours Disp. Time Disposition Final User 06/03/2020 7:58:15 AM General Information Provided Yes Elvera Maria Call Closed By: Elvera Maria Transaction Date/Time: 06/03/2020 7:55:25 AM (ET)

## 2020-06-10 ENCOUNTER — Other Ambulatory Visit: Payer: Self-pay

## 2020-06-10 ENCOUNTER — Encounter: Payer: Self-pay | Admitting: Family Medicine

## 2020-06-10 ENCOUNTER — Ambulatory Visit (INDEPENDENT_AMBULATORY_CARE_PROVIDER_SITE_OTHER): Payer: 59 | Admitting: Family Medicine

## 2020-06-10 ENCOUNTER — Encounter: Payer: Self-pay | Admitting: Gastroenterology

## 2020-06-10 VITALS — BP 160/102 | HR 67 | Temp 96.5°F | Ht 74.0 in | Wt 277.5 lb

## 2020-06-10 DIAGNOSIS — E119 Type 2 diabetes mellitus without complications: Secondary | ICD-10-CM

## 2020-06-10 DIAGNOSIS — Z125 Encounter for screening for malignant neoplasm of prostate: Secondary | ICD-10-CM

## 2020-06-10 DIAGNOSIS — Z0000 Encounter for general adult medical examination without abnormal findings: Secondary | ICD-10-CM

## 2020-06-10 DIAGNOSIS — N529 Male erectile dysfunction, unspecified: Secondary | ICD-10-CM

## 2020-06-10 DIAGNOSIS — E291 Testicular hypofunction: Secondary | ICD-10-CM

## 2020-06-10 DIAGNOSIS — Z1211 Encounter for screening for malignant neoplasm of colon: Secondary | ICD-10-CM

## 2020-06-10 DIAGNOSIS — I10 Essential (primary) hypertension: Secondary | ICD-10-CM

## 2020-06-10 DIAGNOSIS — Z7189 Other specified counseling: Secondary | ICD-10-CM

## 2020-06-10 LAB — COMPREHENSIVE METABOLIC PANEL
ALT: 27 U/L (ref 0–53)
AST: 23 U/L (ref 0–37)
Albumin: 4.7 g/dL (ref 3.5–5.2)
Alkaline Phosphatase: 34 U/L — ABNORMAL LOW (ref 39–117)
BUN: 16 mg/dL (ref 6–23)
CO2: 25 mEq/L (ref 19–32)
Calcium: 9.6 mg/dL (ref 8.4–10.5)
Chloride: 101 mEq/L (ref 96–112)
Creatinine, Ser: 1.17 mg/dL (ref 0.40–1.50)
GFR: 79.24 mL/min (ref >60.00)
Glucose, Bld: 118 mg/dL — ABNORMAL HIGH (ref 70–99)
Potassium: 3.9 mEq/L (ref 3.5–5.1)
Sodium: 137 mEq/L (ref 135–145)
Total Bilirubin: 0.5 mg/dL (ref 0.2–1.2)
Total Protein: 7.5 g/dL (ref 6.0–8.3)

## 2020-06-10 LAB — PSA: PSA: 0.68 ng/mL (ref 0.10–4.00)

## 2020-06-10 LAB — TESTOSTERONE: Testosterone: 408.88 ng/dL (ref 300.00–890.00)

## 2020-06-10 LAB — MICROALBUMIN / CREATININE URINE RATIO
Creatinine,U: 157.3 mg/dL
Microalb Creat Ratio: 2.2 mg/g (ref 0.0–30.0)
Microalb, Ur: 3.4 mg/dL — ABNORMAL HIGH (ref 0.0–1.9)

## 2020-06-10 LAB — LIPID PANEL
Cholesterol: 209 mg/dL — ABNORMAL HIGH (ref 0–200)
HDL: 40.2 mg/dL (ref >39.00)
Total CHOL/HDL Ratio: 5
Triglycerides: 553 mg/dL — ABNORMAL HIGH (ref 0.0–149.0)

## 2020-06-10 LAB — HEMOGLOBIN A1C: Hgb A1c MFr Bld: 6.7 % — ABNORMAL HIGH (ref 4.6–6.5)

## 2020-06-10 LAB — LDL CHOLESTEROL, DIRECT: Direct LDL: 83 mg/dL

## 2020-06-10 NOTE — Progress Notes (Signed)
CPE- See plan.  Routine anticipatory guidance given to patient.  See health maintenance.  The possibility exists that previously documented standard health maintenance information may have been brought forward from a previous encounter into this note.  If needed, that same information has been updated to reflect the current situation based on today's encounter.    Tetanus 2020 Flu d/w pt.  PNA d/w pt.  Shingles due at 22.  covid vaccine d/w pt.  Encouraged.   Living will d/w pt. If patient incapacitated, then [employer].  Diet and exercise d/w pt.  Encouraged both.   D/w patient [id number] for colon cancer screening, including IFOB vs. colonoscopy.  Risks and benefits of both were discussed and patient voiced understanding.  Pt elects for: colonoscopy.   PSA pending.   He is busy with work.  Discussed.  Diabetes:  No meds.   Hypoglycemic episodes: no sx Hyperglycemic episodes: no sx Feet problems: some episodic tingling noted.  Longstanding.   Blood Sugars averaging: not checked.   eye exam within last year: due, d/w pt.    ED noted.  D/w pt about checking T level today.  He has been taking clomid 50mg  a day and anastrozole 0.5mg  weekly.  I did not know about this until today.  D/w pt about stopping both.    Hypertension:    Using medication without problems or lightheadedness: yes Chest pain with exertion:no Edema:no Short of breath:no Lipids pending.    PMH and SH reviewed.  Vital signs, Meds and allergies reviewed.  ROS: Per HPI unless specifically indicated in ROS section   GEN: nad, alert and oriented HEENT: ncat NECK: supple w/o LA CV: rrr PULM: ctab, no inc wob ABD: soft, +bs EXT: no edema SKIN: no acute rash  Diabetic foot exam: Normal inspection No skin breakdown No calluses  Normal DP pulses Normal sensation to light tough and monofilament Nails normal

## 2020-06-10 NOTE — Assessment & Plan Note (Signed)
Living will d/w pt.  If patient incapacitated, then Kimberely Neal designated.   

## 2020-06-10 NOTE — Patient Instructions (Addendum)
Go to the lab on the way out.   If you have mychart we'll likely use that to update you.     We'll call about seeing GI.   Take care.  Glad to see you.  I would get a covid vaccine.   Let me know about your BP readings at home.  Update me when we contact you about your labs.    Please get me a copy of your outside labs.  I would stop clomid and anastrozole in the meantime.

## 2020-06-12 ENCOUNTER — Other Ambulatory Visit: Payer: Self-pay | Admitting: Family Medicine

## 2020-06-12 MED ORDER — LOSARTAN POTASSIUM-HCTZ 100-25 MG PO TABS: 1.0000 | ORAL_TABLET | Freq: Every day | ORAL | 1 refills | Status: DC

## 2020-06-12 NOTE — Assessment & Plan Note (Signed)
No meds at this point.  See notes on labs.

## 2020-06-12 NOTE — Assessment & Plan Note (Signed)
No change in meds at this point.  See notes on labs.  Discussed with patient about diet and exercise.

## 2020-06-12 NOTE — Assessment & Plan Note (Signed)
ED noted.  D/w pt about checking T level today.  He has been taking clomid 50mg  a day and anastrozole 0.5mg  weekly.  I did not know about this until today.  D/w pt about stopping both.

## 2020-06-12 NOTE — Assessment & Plan Note (Signed)
Tetanus 2020 Flu d/w pt.  PNA d/w pt.  Shingles due at 50.  covid vaccine d/w pt.  Encouraged.   Living will d/w pt. If patient incapacitated, then [employer].  Diet and exercise d/w pt.  Encouraged both.   D/w patient [id number] for colon cancer screening, including IFOB vs. colonoscopy.  Risks and benefits of both were discussed and patient voiced understanding.  Pt elects for: colonoscopy.   Referral placed.   PSA pending.

## 2020-07-12 ENCOUNTER — Other Ambulatory Visit: Payer: Self-pay | Admitting: Podiatry

## 2020-07-12 ENCOUNTER — Ambulatory Visit (INDEPENDENT_AMBULATORY_CARE_PROVIDER_SITE_OTHER): Payer: 59 | Admitting: Podiatry

## 2020-07-12 ENCOUNTER — Encounter: Payer: Self-pay | Admitting: Podiatry

## 2020-07-12 ENCOUNTER — Ambulatory Visit (INDEPENDENT_AMBULATORY_CARE_PROVIDER_SITE_OTHER): Payer: 59

## 2020-07-12 ENCOUNTER — Other Ambulatory Visit: Payer: Self-pay

## 2020-07-12 DIAGNOSIS — M899 Disorder of bone, unspecified: Secondary | ICD-10-CM

## 2020-07-12 DIAGNOSIS — M205X1 Other deformities of toe(s) (acquired), right foot: Secondary | ICD-10-CM | POA: Diagnosis not present

## 2020-07-12 DIAGNOSIS — M778 Other enthesopathies, not elsewhere classified: Secondary | ICD-10-CM | POA: Diagnosis not present

## 2020-07-12 NOTE — Progress Notes (Signed)
  Subjective:  Patient ID: Cameron Mcneil, male    DOB: Apr 22, 1966,  MRN: [id number] HPI Chief Complaint  Patient presents with  . Foot Pain    Patient states that he feels like since his joint replacement, the big toe sticks up some and causes him to put more pressure on the ball of the foot and little toe-RIGHT FOOT    54 y.o. male presents with the above complaint.   ROS: Denies fever chills nausea vomiting muscle aches pains calf pain back pain chest pain shortness of breath.  Past Medical History:  Diagnosis Date  . Allergic rhinitis   . Diabetes mellitus without complication ([place])   . ED (erectile dysfunction)   . Hyperlipidemia   . Hypertension   . Hypogonadism male    Past Surgical History:  Procedure Laterality Date  . ORIF ANKLE FRACTURE Right 11/12/2013   Procedure: OPEN REDUCTION INTERNAL FIXATION (ORIF) ANKLE FRACTURE/ irrigation and debridement of right forefoot wound  dehisence;  Surgeon: Wylene Simmer, MD;  Location: [facility];  Service: Orthopedics;  Laterality: Right;  . TOE SURGERY Right 11/06/2013   [facility]     [facility]           DR  Twylia Oka    Current Outpatient Medications:  .  losartan-hydrochlorothiazide (HYZAAR) 100-25 MG tablet, Take 1 tablet by mouth daily., Disp: 90 tablet, Rfl: 1  No Known Allergies Review of Systems Objective:  There were no vitals filed for this visit.  General: Well developed, nourished, in no acute distress, alert and oriented x3   Dermatological: Skin is warm, dry and supple bilateral. Nails x 10 are well maintained; remaining integument appears unremarkable at this time. There are no open sores, no preulcerative lesions, no rash or signs of infection present.  Vascular: Dorsalis Pedis artery and Posterior Tibial artery pedal pulses are 2/4 bilateral with immedate capillary fill time. Pedal hair growth present. No varicosities and no lower extremity edema present bilateral.   Neruologic: Grossly intact via light touch bilateral.  Vibratory intact via tuning fork bilateral. Protective threshold with Semmes Wienstein monofilament intact to all pedal sites bilateral. Patellar and Achilles deep tendon reflexes 2+ bilateral. No Babinski or clonus noted bilateral.   Musculoskeletal: No gross boney pedal deformities bilateral. No pain, crepitus, or limitation noted with foot and ankle range of motion bilateral. Muscular strength 5/5 in all groups tested bilateral.  He has some tenderness beneath the sesamoids of the first metatarsophalangeal joint most likely secondary to the mallet toe deformity which developed after fracture.  He has mild tenderness on palpation of the fifth metatarsal most likely compensatory.  Gait: Unassisted, Nonantalgic.    Radiographs:  Radiographs taken today demonstrate a plate and screws with a well-healed fracture ankle right and a mallet toe deformity with a Keller arthroplasty single silicone implant.  Assessment & Plan:   Assessment: Mallet toe deformity sesamoiditis plantarflexed first metatarsal and capsulitis right foot.  Plan: Discussed etiology pathology conservative versus surgical therapies at this point time referred him to [facility] for a small met pad and a subone cut out orthotic to be made for that mallet toe deformity I need the orthotic to be as thin as possible he likes to wear dress loafers.  The left orthotic will be made in neutral.     Arnelle Nale T. Nixon, [place]

## 2020-07-15 ENCOUNTER — Ambulatory Visit (INDEPENDENT_AMBULATORY_CARE_PROVIDER_SITE_OTHER): Payer: 59 | Admitting: Orthotics

## 2020-07-15 ENCOUNTER — Other Ambulatory Visit: Payer: Self-pay

## 2020-07-15 DIAGNOSIS — M778 Other enthesopathies, not elsewhere classified: Secondary | ICD-10-CM | POA: Diagnosis not present

## 2020-07-15 NOTE — Progress Notes (Signed)
Cast for dress shoe type cmfo (slim)' with sesmoid offloads, 1st met cut out.

## 2020-08-03 ENCOUNTER — Ambulatory Visit (AMBULATORY_SURGERY_CENTER): Payer: Self-pay | Admitting: *Deleted

## 2020-08-03 ENCOUNTER — Other Ambulatory Visit: Payer: Self-pay

## 2020-08-03 VITALS — Ht 74.0 in | Wt 270.0 lb

## 2020-08-03 DIAGNOSIS — Z1211 Encounter for screening for malignant neoplasm of colon: Secondary | ICD-10-CM

## 2020-08-03 DIAGNOSIS — Z01818 Encounter for other preprocedural examination: Secondary | ICD-10-CM

## 2020-08-03 MED ORDER — NA SULFATE-K SULFATE-MG SULF 17.5-3.13-1.6 GM/177ML PO SOLN: ORAL | 0 refills | Status: DC

## 2020-08-03 NOTE — Progress Notes (Signed)
Patient is here in-person for PV. Patient denies any allergies to eggs or soy. Patient denies any problems with anesthesia/sedation. Patient denies any oxygen use at home. Patient denies taking any diet/weight loss medications or blood thinners. Patient is not being treated for MRSA or C-diff. Patient is aware of our care-partner policy and [id number] safety protocol. EMMI education sent to mychart  COVID-19 screening test is on 9/20, the pt is aware. Patient states completed covid vaccine x1 only. 2nd shot not due until end of sept.

## 2020-08-12 ENCOUNTER — Encounter: Payer: 59 | Admitting: Orthotics

## 2020-08-16 ENCOUNTER — Telehealth: Payer: Self-pay | Admitting: *Deleted

## 2020-08-16 NOTE — Telephone Encounter (Signed)
Called pt.concerning covid test,he  stated" I have had someone recently die from covid," "I  Had my second dose of covid vaccine  yesterday  He decided to cancel procedure for tomorrow and will call back and rescheduled procedure.

## 2020-08-17 ENCOUNTER — Encounter: Payer: 59 | Admitting: Gastroenterology

## 2020-09-27 NOTE — Telephone Encounter (Signed)
Pt seen 06/10/20.

## 2020-12-26 ENCOUNTER — Other Ambulatory Visit: Payer: Self-pay | Admitting: Family Medicine

## 2020-12-27 ENCOUNTER — Telehealth: Payer: Self-pay

## 2020-12-27 NOTE — Telephone Encounter (Signed)
[facility] that losartan-HCTZ 100/25 mg tabs are not available. Can we send in separately?

## 2020-12-28 MED ORDER — LOSARTAN POTASSIUM 100 MG PO TABS: 100.0000 mg | ORAL_TABLET | Freq: Every day | ORAL | 1 refills | Status: DC

## 2020-12-28 MED ORDER — HYDROCHLOROTHIAZIDE 25 MG PO TABS
25.0000 mg | ORAL_TABLET | Freq: Every day | ORAL | 1 refills | Status: DC
Start: 2020-12-28 — End: 2021-04-05

## 2020-12-28 NOTE — Telephone Encounter (Signed)
Sent. Thanks.   

## 2020-12-28 NOTE — Addendum Note (Signed)
Addended by: Tonia Ghent on: 12/28/2020 03:22 PM   Modules accepted: Orders

## 2021-03-23 ENCOUNTER — Telehealth: Payer: Self-pay | Admitting: Family Medicine

## 2021-03-23 NOTE — Telephone Encounter (Signed)
Patient has not been seen since 07/21; he needs an appt to discuss. Please call to schedule an appt.

## 2021-03-23 NOTE — Telephone Encounter (Signed)
Pt called in wanted to know about getting a referral to a urologist

## 2021-04-03 ENCOUNTER — Other Ambulatory Visit: Payer: Self-pay

## 2021-04-03 ENCOUNTER — Ambulatory Visit (INDEPENDENT_AMBULATORY_CARE_PROVIDER_SITE_OTHER): Payer: 59 | Admitting: Family Medicine

## 2021-04-03 ENCOUNTER — Encounter: Payer: Self-pay | Admitting: Family Medicine

## 2021-04-03 VITALS — BP 122/84 | HR 74 | Temp 97.1°F | Ht 74.0 in | Wt 275.0 lb

## 2021-04-03 DIAGNOSIS — R300 Dysuria: Secondary | ICD-10-CM | POA: Diagnosis not present

## 2021-04-03 DIAGNOSIS — Z125 Encounter for screening for malignant neoplasm of prostate: Secondary | ICD-10-CM

## 2021-04-03 DIAGNOSIS — Z0000 Encounter for general adult medical examination without abnormal findings: Secondary | ICD-10-CM

## 2021-04-03 DIAGNOSIS — E119 Type 2 diabetes mellitus without complications: Secondary | ICD-10-CM

## 2021-04-03 DIAGNOSIS — I10 Essential (primary) hypertension: Secondary | ICD-10-CM

## 2021-04-03 DIAGNOSIS — Z7189 Other specified counseling: Secondary | ICD-10-CM

## 2021-04-03 LAB — CBC WITH DIFFERENTIAL/PLATELET
Basophils Absolute: 0 10*3/uL (ref 0.0–0.1)
Basophils Relative: 0.4 % (ref 0.0–3.0)
Eosinophils Absolute: 0.1 10*3/uL (ref 0.0–0.7)
Eosinophils Relative: 1 % (ref 0.0–5.0)
HCT: 47.5 % (ref 39.0–52.0)
Hemoglobin: 16.2 g/dL (ref 13.0–17.0)
Lymphocytes Relative: 19.1 % (ref 12.0–46.0)
Lymphs Abs: 1.4 10*3/uL (ref 0.7–4.0)
MCHC: 34 g/dL (ref 30.0–36.0)
MCV: 81.6 fl (ref 78.0–100.0)
Monocytes Absolute: 0.5 10*3/uL (ref 0.1–1.0)
Monocytes Relative: 6.6 % (ref 3.0–12.0)
Neutro Abs: 5.3 10*3/uL (ref 1.4–7.7)
Neutrophils Relative %: 72.9 % (ref 43.0–77.0)
Platelets: 230 10*3/uL (ref 150.0–400.0)
RBC: 5.81 Mil/uL (ref 4.22–5.81)
RDW: 13.2 % (ref 11.5–15.5)
WBC: 7.2 10*3/uL (ref 4.0–10.5)

## 2021-04-03 LAB — POC URINALSYSI DIPSTICK (AUTOMATED)
Bilirubin, UA: NEGATIVE
Glucose, UA: NEGATIVE
Ketones, UA: NEGATIVE
Leukocytes, UA: NEGATIVE
Nitrite, UA: NEGATIVE
Protein, UA: POSITIVE — AB
Spec Grav, UA: >=1.03 — AB (ref 1.010–1.025)
Urobilinogen, UA: 0.2 E.U./dL
pH, UA: 5.5 (ref 5.0–8.0)

## 2021-04-03 LAB — LIPID PANEL
Cholesterol: 197 mg/dL (ref 0–200)
HDL: 52.3 mg/dL (ref >39.00)
LDL Cholesterol: 111 mg/dL — ABNORMAL HIGH (ref 0–99)
NonHDL: 144.37
Total CHOL/HDL Ratio: 4
Triglycerides: 165 mg/dL — ABNORMAL HIGH (ref 0.0–149.0)
VLDL: 33 mg/dL (ref 0.0–40.0)

## 2021-04-03 LAB — COMPREHENSIVE METABOLIC PANEL
ALT: 25 U/L (ref 0–53)
AST: 19 U/L (ref 0–37)
Albumin: 4.7 g/dL (ref 3.5–5.2)
Alkaline Phosphatase: 33 U/L — ABNORMAL LOW (ref 39–117)
BUN: 15 mg/dL (ref 6–23)
CO2: 29 mEq/L (ref 19–32)
Calcium: 9.6 mg/dL (ref 8.4–10.5)
Chloride: 100 mEq/L (ref 96–112)
Creatinine, Ser: 1.15 mg/dL (ref 0.40–1.50)
GFR: 73.06 mL/min (ref >60.00)
Glucose, Bld: 125 mg/dL — ABNORMAL HIGH (ref 70–99)
Potassium: 4.1 mEq/L (ref 3.5–5.1)
Sodium: 136 mEq/L (ref 135–145)
Total Bilirubin: 0.6 mg/dL (ref 0.2–1.2)
Total Protein: 7.4 g/dL (ref 6.0–8.3)

## 2021-04-03 LAB — PSA: PSA: 0.64 ng/mL (ref 0.10–4.00)

## 2021-04-03 LAB — HEMOGLOBIN A1C: Hgb A1c MFr Bld: 7.1 % — ABNORMAL HIGH (ref 4.6–6.5)

## 2021-04-03 NOTE — Patient Instructions (Signed)
Take care.  Glad to see you. Go to the lab on the way out.   If you have mychart we'll likely use that to update you.    Don't change your meds yet.  Let me get your labs and we'll go from there.

## 2021-04-03 NOTE — Progress Notes (Signed)
This visit occurred during the SARS-CoV-2 public health emergency.  Safety protocols were in place, including screening questions prior to the visit, additional usage of staff PPE, and extensive cleaning of exam room while observing appropriate contact time as indicated for disinfecting solutions.  CPE- See plan.  Routine anticipatory guidance given to patient.  See health maintenance.  The possibility exists that previously documented standard health maintenance information may have been brought forward from a previous encounter into this note.  If needed, that same information has been updated to reflect the current situation based on today's encounter.    Tetanus 2020 Flu d/w pt.  PNA d/w pt.  Shingles d/w pt.   covid vaccine done.    Living will d/w pt. If patient incapacitated, then [employer].  Diet and exercise d/w pt.  Encouraged both.   Colonoscopy pending.   PSA pending.   HIV and HCV screening prev done since 2010.    He is busy with work.  Discussed.  Diabetes:  No meds.   Hypoglycemic episodes: no sx Hyperglycemic episodes: no sx Feet problems: some occ tinging in the feet at baseline.   Blood Sugars averaging: ~120s on recent check.  Higher if not fasting.   eye exam within last year: due, d/w pt.    He is off clomid, etc.    Hypertension:               Using medication without problems or lightheadedness: yes Chest pain with exertion:no Edema:no Short of breath:no He has urinary urgency with separate HCTZ and ARB.  It didn't happen with old rx (comination medication).  Meds, vitals, and allergies reviewed.   ROS: Per HPI unless specifically indicated in ROS section   GEN: nad, alert and oriented HEENT: ncat NECK: supple w/o LA CV: rrr PULM: ctab, no inc wob ABD: soft, +bs EXT: no edema SKIN: no acute rash  Diabetic foot exam: Normal inspection No skin breakdown No calluses  Normal DP pulses Normal sensation to light touch and  monofilament Nails normal

## 2021-04-04 LAB — URINE CULTURE
MICRO NUMBER:: 11866061
Result:: NO GROWTH
SPECIMEN QUALITY:: ADEQUATE

## 2021-04-05 ENCOUNTER — Other Ambulatory Visit: Payer: Self-pay | Admitting: Family Medicine

## 2021-04-05 DIAGNOSIS — R300 Dysuria: Secondary | ICD-10-CM

## 2021-04-05 MED ORDER — LOSARTAN POTASSIUM-HCTZ 100-25 MG PO TABS: 1.0000 | ORAL_TABLET | Freq: Every day | ORAL | 3 refills | Status: DC

## 2021-04-05 NOTE — Assessment & Plan Note (Signed)
He has urinary urgency with separate HCTZ and ARB.  It didn't happen with old rx (comination medication).  I want to check his labs and go from there.  See notes on labs.

## 2021-04-05 NOTE — Assessment & Plan Note (Signed)
No meds.  Follow-up labs pending.  See notes on labs.  Continue work on diet and exercise.

## 2021-04-05 NOTE — Assessment & Plan Note (Signed)
Living will d/w pt.  If patient incapacitated, then Cameron Mcneil designated.   

## 2021-04-05 NOTE — Assessment & Plan Note (Signed)
Tetanus 2020 Flu d/w pt.  PNA d/w pt.  Shingles d/w pt.   covid vaccine done.    Living will d/w pt. If patient incapacitated, then [employer].  Diet and exercise d/w pt.  Encouraged both.   Colonoscopy pending.   PSA pending.   HIV and HCV screening prev done since 2010.

## 2021-05-03 ENCOUNTER — Other Ambulatory Visit: Payer: Self-pay

## 2021-05-03 ENCOUNTER — Ambulatory Visit (AMBULATORY_SURGERY_CENTER): Payer: Self-pay | Admitting: *Deleted

## 2021-05-03 VITALS — Ht 74.0 in | Wt 265.0 lb

## 2021-05-03 DIAGNOSIS — Z1211 Encounter for screening for malignant neoplasm of colon: Secondary | ICD-10-CM

## 2021-05-03 MED ORDER — NA SULFATE-K SULFATE-MG SULF 17.5-3.13-1.6 GM/177ML PO SOLN: 1.0000 | Freq: Once | ORAL | 0 refills | Status: AC

## 2021-05-03 NOTE — Progress Notes (Addendum)
No egg or soy allergy known to patient  No issues with past sedation with any surgeries or procedures Patient denies ever being told they had issues or difficulty with intubation  No FH of Malignant Hyperthermia No diet pills per patient No home 02 use per patient  No blood thinners per patient  Pt denies issues with constipation  No A fib or A flutter  EMMI video to pt or via MyChart  COVID 19 guidelines implemented in PV today with Pt and RN  Pt is fully vaccinated  for Covid    Discussed with pt there will be an out-of-pocket cost for prep and that varies from $0 to 70 dollars   Due to the COVID-19 pandemic we are asking patients to follow certain guidelines.  Pt aware of COVID protocols and [facility] guidelines   

## 2021-05-05 ENCOUNTER — Encounter: Payer: Self-pay | Admitting: Family Medicine

## 2021-05-05 ENCOUNTER — Telehealth: Payer: Self-pay | Admitting: *Deleted

## 2021-05-05 ENCOUNTER — Telehealth (INDEPENDENT_AMBULATORY_CARE_PROVIDER_SITE_OTHER): Payer: 59 | Admitting: Family Medicine

## 2021-05-05 DIAGNOSIS — U071 COVID-19: Secondary | ICD-10-CM | POA: Diagnosis not present

## 2021-05-05 MED ORDER — NIRMATRELVIR/RITONAVIR (PAXLOVID)TABLET: 3.0000 | ORAL_TABLET | Freq: Two times a day (BID) | ORAL | 0 refills | Status: DC

## 2021-05-05 MED ORDER — NIRMATRELVIR/RITONAVIR (PAXLOVID)TABLET: 3.0000 | ORAL_TABLET | Freq: Two times a day (BID) | ORAL | 0 refills | Status: AC

## 2021-05-05 NOTE — Telephone Encounter (Signed)
Patient called the office stating that he has tested positive for coivd. Patient stated that he started with symptoms Monday and the home covid test was positive yesterday. Patient stated that he has had body aches,, fatigue, chills, cough and scratchy throat. Patent wants to know if you will prescribe for him the antiviral medication for covid. Patent was given quarantine protocols. Patent denies SOB or difficulty breathing. Patient was given ER precautions and he verbalized understanding. Patient stated that he has misplaced his blood pressure medication and wanted it called into the pharmacy. Patient was advised that he has refills and he should contact his pharmacy. Pharmacy [facility]

## 2021-05-05 NOTE — Progress Notes (Signed)
Interactive audio and video telecommunications were attempted between this provider and patient, however failed, due to patient having technical difficulties OR patient did not have access to video capability.  We continued and completed visit with audio only.   Virtual Visit via Telephone Note  I connected with patient on 05/05/21  at 5:15 PM  by telephone and verified that I am speaking with the correct person using two identifiers.  Location of patient: home   Location of MD: [place] Name of referring provider (if blank then none associated): Names per persons and role in encounter:  MD: Earlyne Iba, Patient: name listed above.    I discussed the limitations, risks, security and privacy concerns of performing an evaluation and management service by telephone and the availability of in person appointments. I also discussed with the patient that there may be a patient responsible charge related to this service. The patient expressed understanding and agreed to proceed.  CC: COVID.    History of Present Illness:  Sx started 6/7, felt more sx in the last few days.  Today is day #4.  Fatigued.  temp ~100, aches.  Chills.  Sweats.  Dec in appetite.  HA.  Taking fluid by mouth.  No vomiting. No diarrhea.  Can still take a deep breath.  Not SOB.  Home COVID test positive.     Observations/Objective:  No apparent distress.  Speech normal.   Assessment and Plan: covid Okay for outpatient follow-up.  Not short of breath.  Discussed supportive care.  Discussed emergency authorization for antivirals.  Reasonable to proceed with Paxlovid with routine cautions d/w pt. normal dose of medication given his kidney function.  Discussed.  He can update [place] as needed.  Follow Up Instructions: see above.    I discussed the assessment and treatment plan with the patient. The patient was provided an opportunity to ask questions and all were answered. The patient agreed with the plan and demonstrated  an understanding of the instructions.   The patient was advised to call back or seek an in-person evaluation if the symptoms worsen or if the condition fails to improve as anticipated.  I provided 15 minutes of non-face-to-face time during this encounter.   Elsie Stain, MD

## 2021-05-05 NOTE — Telephone Encounter (Signed)
Would need to add him on as a phone visit at the end of the day.  That is the best option, ~5pm.  If added on, let me know.  Thanks.

## 2021-05-05 NOTE — Telephone Encounter (Signed)
Pt was added on. Spoke to pt.

## 2021-05-07 DIAGNOSIS — U071 COVID-19: Secondary | ICD-10-CM | POA: Insufficient documentation

## 2021-05-07 NOTE — Assessment & Plan Note (Signed)
Okay for outpatient follow-up.  Not short of breath.  Discussed supportive care.  Discussed emergency authorization for antivirals.  Reasonable to proceed with Paxlovid with routine cautions d/w pt. normal dose of medication given his kidney function.  Discussed.  He can update [place] as needed.

## 2021-05-17 ENCOUNTER — Other Ambulatory Visit: Payer: Self-pay

## 2021-05-17 ENCOUNTER — Ambulatory Visit (AMBULATORY_SURGERY_CENTER): Payer: 59 | Admitting: Gastroenterology

## 2021-05-17 ENCOUNTER — Encounter: Payer: Self-pay | Admitting: Gastroenterology

## 2021-05-17 VITALS — BP 128/85 | HR 63 | Temp 97.5°F | Resp 16 | Ht 74.0 in | Wt 265.0 lb

## 2021-05-17 DIAGNOSIS — Z1211 Encounter for screening for malignant neoplasm of colon: Secondary | ICD-10-CM | POA: Diagnosis not present

## 2021-05-17 DIAGNOSIS — Z538 Procedure and treatment not carried out for other reasons: Secondary | ICD-10-CM | POA: Diagnosis not present

## 2021-05-17 HISTORY — PX: COLONOSCOPY: SHX174

## 2021-05-17 MED ORDER — SODIUM CHLORIDE 0.9 % IV SOLN: 500.0000 mL | Freq: Once | INTRAVENOUS | Status: DC

## 2021-05-17 NOTE — Patient Instructions (Signed)
Your prep was not complete.  You have another colonoscopy and pre-visit  scheduled.  You will need a more extensive prep next time.  YOU HAD AN ENDOSCOPIC PROCEDURE TODAY AT [facility]:   Refer to the procedure report that was given to you for any specific questions about what was found during the examination.  If the procedure report does not answer your questions, please call your gastroenterologist to clarify.  If you requested that your care partner not be given the details of your procedure findings, then the procedure report has been included in a sealed envelope for you to review at your convenience later.  YOU SHOULD EXPECT: Some feelings of bloating in the abdomen. Passage of more gas than usual.  Walking can help get rid of the air that was put into your GI tract during the procedure and reduce the bloating. If you had a lower endoscopy (such as a colonoscopy or flexible sigmoidoscopy) you may notice spotting of blood in your stool or on the toilet paper. If you underwent a bowel prep for your procedure, you may not have a normal bowel movement for a few days.  Please Note:  You might notice some irritation and congestion in your nose or some drainage.  This is from the oxygen used during your procedure.  There is no need for concern and it should clear up in a day or so.  SYMPTOMS TO REPORT IMMEDIATELY:  Following lower endoscopy (colonoscopy or flexible sigmoidoscopy):  Excessive amounts of blood in the stool  Significant tenderness or worsening of abdominal pains  Swelling of the abdomen that is new, acute  Fever of 100F or higher  For urgent or emergent issues, a gastroenterologist can be reached at any hour by calling [phone]. Do not use MyChart messaging for urgent concerns.    DIET:  We do recommend a small meal at first, but then you may proceed to your regular diet.  Drink plenty of fluids but you should avoid alcoholic beverages for 24  hours.  ACTIVITY:  You should plan to take it easy for the rest of today and you should NOT DRIVE or use heavy machinery until tomorrow (because of the sedation medicines used during the test).    FOLLOW UP: Our staff will call the number listed on your records 48-72 hours following your procedure to check on you and address any questions or concerns that you may have regarding the information given to you following your procedure. If we do not reach you, we will leave a message.  We will attempt to reach you two times.  During this call, we will ask if you have developed any symptoms of COVID 19. If you develop any symptoms (ie: fever, flu-like symptoms, shortness of breath, cough etc.) before then, please call [phone].  If you test positive for Covid 19 in the 2 weeks post procedure, please call and report this information to [place].    If any biopsies were taken you will be contacted by phone or by letter within the next 1-3 weeks.  Please call us at [phone] if you have not heard about the biopsies in 3 weeks.    SIGNATURES/CONFIDENTIALITY: You and/or your care partner have signed paperwork which will be entered into your electronic medical record.  These signatures attest to the fact that that the information above on your After Visit Summary has been reviewed and is understood.  Full responsibility of the confidentiality of this discharge information lies  with you and/or your care-partner.  

## 2021-05-17 NOTE — Progress Notes (Signed)
Vs by CW in adm  Pt's states no medical or surgical changes since previsit or office visit. - except pt tested positive for Covid 05/06/21 ,tested negative on 05/15/21

## 2021-05-17 NOTE — Progress Notes (Signed)
Dr Ardis Hughs notified by Doristine Bosworth that pt took both parts of Suprep yesterday 6pm - first part, 11 pm 2nd part . None this am. Pt went by instructions on suprep box instead of pv letter intsructions . Pt reports he ate full meal yesterday for dinner @ 1700 before beginning prep @ 6 pm. Pt reports last BM is clear yellow tinted liquid .

## 2021-05-17 NOTE — Progress Notes (Signed)
A/ox3, pleased with MAC, report to RN 

## 2021-05-17 NOTE — Op Note (Signed)
[place] Patient Name: Cameron Mcneil Procedure Date: 05/17/2021 9:18 AM MRN: [id number] Endoscopist: Milus Banister , MD Age: 55 Referring MD:  Date of Birth: March 21, 1966 Gender: Male Account #: [id number] Procedure:                Colonoscopy Indications:              Screening for colorectal malignant neoplasm Medicines:                Monitored Anesthesia Care Procedure:                Pre-Anesthesia Assessment:                           - Prior to the procedure, a History and Physical                            was performed, and patient medications and                            allergies were reviewed. The patient's tolerance of                            previous anesthesia was also reviewed. The risks                            and benefits of the procedure and the sedation                            options and risks were discussed with the patient.                            All questions were answered, and informed consent                            was obtained. Prior Anticoagulants: The patient has                            taken no previous anticoagulant or antiplatelet                            agents. ASA Grade Assessment: III - A patient with                            severe systemic disease. After reviewing the risks                            and benefits, the patient was deemed in                            satisfactory condition to undergo the procedure.                           After obtaining informed consent, the colonoscope  was passed under direct vision. Throughout the                            procedure, the patient's blood pressure, pulse, and                            oxygen saturations were monitored continuously. The                            Olympus CF-[id number]) [id number] was introduced                            through the anus and advanced to the the cecum,                            identified by  appendiceal orifice and ileocecal                            valve. The colonoscopy was performed without                            difficulty. The patient tolerated the procedure                            well. The quality of the bowel preparation was                            poor. The appendiceal orifice and the rectum were                            photographed. Scope In: 9:34:43 AM Scope Out: 9:39:32 AM Scope Withdrawal Time: 0 hours 2 minutes 10 seconds  Total Procedure Duration: 0 hours 4 minutes 49 seconds  Findings:                 A large amount of stool was found in the entire                            colon, precluding visualization.                           The exam was otherwise without abnormality on                            direct and retroflexion views. Complications:            No immediate complications. Estimated blood loss:                            None. Estimated Blood Loss:     Estimated blood loss: none. Impression:               - Preparation of the colon was poor and so this was  an INCOMPLETE EXAMINATION Recommendation:           - Patient has a contact number available for                            emergencies. The signs and symptoms of potential                            delayed complications were discussed with the                            patient. Return to normal activities tomorrow.                            Written discharge instructions were provided to the                            patient.                           - Resume previous diet.                           - Continue present medications.                           - Repeat colonoscopy at the next available                            appointment for screening (with double prep                            protocol) Milus Banister, MD 05/17/2021 9:42:21 AM This report has been signed electronically.

## 2021-05-17 NOTE — Progress Notes (Signed)
Dr Ardis Hughs notified by Doristine Bosworth that pt took both parts of Suprep yesterday 6pm - first part, 11 pm 2nd part . None this am. Pt went by instructions on suprep box instead of pv letter intsructions . Pt reports he ate full meal yesterday for dinner @ 1700 before beginning prep @ 6 pm. Pt reports last BM is clear yellow tinted liquid .  Per Dr. Ardis Hughs, he will proceed with colonoscopy today.

## 2021-05-19 ENCOUNTER — Telehealth: Payer: Self-pay | Admitting: *Deleted

## 2021-05-19 NOTE — Telephone Encounter (Signed)
Attempted follow-up. Voicemail mailbox full and not accepting messages.

## 2021-05-26 ENCOUNTER — Encounter: Payer: Self-pay | Admitting: *Deleted

## 2021-05-26 ENCOUNTER — Other Ambulatory Visit: Payer: Self-pay

## 2021-05-26 ENCOUNTER — Ambulatory Visit (AMBULATORY_SURGERY_CENTER): Payer: 59 | Admitting: *Deleted

## 2021-05-26 VITALS — Ht 74.0 in | Wt 272.0 lb

## 2021-05-26 DIAGNOSIS — Z1211 Encounter for screening for malignant neoplasm of colon: Secondary | ICD-10-CM

## 2021-05-26 MED ORDER — NA SULFATE-K SULFATE-MG SULF 17.5-3.13-1.6 GM/177ML PO SOLN: ORAL | 0 refills | Status: DC

## 2021-05-26 NOTE — Progress Notes (Signed)
Patient is here in-person for PV. Patient denies any allergies to eggs or soy. Patient denies any problems with anesthesia/sedation. Patient denies any oxygen use at home. Patient denies taking any diet/weight loss medications or blood thinners. Patient is not being treated for MRSA or C-diff. Patient is aware of our care-partner policy and JOITG-54 safety protocol.   Patient is COVID-19 vaccinated, per patient.  Patient denies any medical hx changes since last GI visit.

## 2021-06-09 ENCOUNTER — Ambulatory Visit (AMBULATORY_SURGERY_CENTER): Payer: 59 | Admitting: Gastroenterology

## 2021-06-09 ENCOUNTER — Encounter: Payer: Self-pay | Admitting: Gastroenterology

## 2021-06-09 ENCOUNTER — Other Ambulatory Visit: Payer: Self-pay

## 2021-06-09 VITALS — BP 142/96 | HR 64 | Temp 98.0°F | Resp 19 | Ht 74.0 in | Wt 272.0 lb

## 2021-06-09 DIAGNOSIS — D122 Benign neoplasm of ascending colon: Secondary | ICD-10-CM

## 2021-06-09 DIAGNOSIS — Z1211 Encounter for screening for malignant neoplasm of colon: Secondary | ICD-10-CM | POA: Diagnosis present

## 2021-06-09 MED ORDER — SODIUM CHLORIDE 0.9 % IV SOLN: 500.0000 mL | Freq: Once | INTRAVENOUS | Status: DC

## 2021-06-09 NOTE — Progress Notes (Signed)
Pt's states no medical or surgical changes since previsit or office visit.  VS CW  

## 2021-06-09 NOTE — Op Note (Signed)
[place] Patient Name: Cameron Mcneil Procedure Date: 06/09/2021 8:15 AM MRN: [id number] Endoscopist: Milus Banister , MD Age: 55 Referring MD:  Date of Birth: 05/15/1966 Gender: Male Account #: [id number] Procedure:                Colonoscopy Indications:              Screening for colorectal malignant neoplasm Medicines:                Monitored Anesthesia Care Procedure:                Pre-Anesthesia Assessment:                           - Prior to the procedure, a History and Physical                            was performed, and patient medications and                            allergies were reviewed. The patient's tolerance of                            previous anesthesia was also reviewed. The risks                            and benefits of the procedure and the sedation                            options and risks were discussed with the patient.                            All questions were answered, and informed consent                            was obtained. Prior Anticoagulants: The patient has                            taken no previous anticoagulant or antiplatelet                            agents. ASA Grade Assessment: II - A patient with                            mild systemic disease. After reviewing the risks                            and benefits, the patient was deemed in                            satisfactory condition to undergo the procedure.                           After obtaining informed consent, the colonoscope  was passed under direct vision. Throughout the                            procedure, the patient's blood pressure, pulse, and                            oxygen saturations were monitored continuously. The                            Olympus CF-[id number]) Colonoscope was                            introduced through the anus and advanced to the the                            cecum, identified by  appendiceal orifice and                            ileocecal valve. The colonoscopy was performed                            without difficulty. The patient tolerated the                            procedure well. The quality of the bowel                            preparation was excellent. The ileocecal valve,                            appendiceal orifice, and rectum were photographed. Scope In: 8:25:35 AM Scope Out: 8:44:19 AM Scope Withdrawal Time: 0 hours 16 minutes 25 seconds  Total Procedure Duration: 0 hours 18 minutes 44 seconds  Findings:                 A 2 mm polyp was found in the ascending colon. The                            polyp was sessile. The polyp was removed with a                            cold biopsy forceps. Resection and retrieval were                            complete.                           Internal hemorrhoids were found. The hemorrhoids                            were small.                           The exam was otherwise without abnormality on  direct and retroflexion views. Complications:            No immediate complications. Estimated blood loss:                            None. Estimated Blood Loss:     Estimated blood loss: none. Impression:               - One 2 mm polyp in the ascending colon, removed                            with a cold biopsy forceps. Resected and retrieved.                           - Internal hemorrhoids.                           - The examination was otherwise normal on direct                            and retroflexion views. Recommendation:           - Patient has a contact number available for                            emergencies. The signs and symptoms of potential                            delayed complications were discussed with the                            patient. Return to normal activities tomorrow.                            Written discharge instructions were provided to  the                            patient.                           - Resume previous diet.                           - Continue present medications.                           - Await pathology results. Milus Banister, MD 06/09/2021 8:46:33 AM This report has been signed electronically.

## 2021-06-09 NOTE — Patient Instructions (Signed)
Handout given:  hemorrhoids, polyps Resume previous diet Continue current medications Await pathology results  YOU HAD AN ENDOSCOPIC PROCEDURE TODAY AT [facility]:   Refer to the procedure report that was given to you for any specific questions about what was found during the examination.  If the procedure report does not answer your questions, please call your gastroenterologist to clarify.  If you requested that your care partner not be given the details of your procedure findings, then the procedure report has been included in a sealed envelope for you to review at your convenience later.  YOU SHOULD EXPECT: Some feelings of bloating in the abdomen. Passage of more gas than usual.  Walking can help get rid of the air that was put into your GI tract during the procedure and reduce the bloating. If you had a lower endoscopy (such as a colonoscopy or flexible sigmoidoscopy) you may notice spotting of blood in your stool or on the toilet paper. If you underwent a bowel prep for your procedure, you may not have a normal bowel movement for a few days.  Please Note:  You might notice some irritation and congestion in your nose or some drainage.  This is from the oxygen used during your procedure.  There is no need for concern and it should clear up in a day or so.  SYMPTOMS TO REPORT IMMEDIATELY:  Following lower endoscopy (colonoscopy or flexible sigmoidoscopy):  Excessive amounts of blood in the stool  Significant tenderness or worsening of abdominal pains  Swelling of the abdomen that is new, acute  Fever of 100F or higher  For urgent or emergent issues, a gastroenterologist can be reached at any hour by calling [phone]. Do not use MyChart messaging for urgent concerns.   DIET:  We do recommend a small meal at first, but then you may proceed to your regular diet.  Drink plenty of fluids but you should avoid alcoholic beverages for 24 hours.  ACTIVITY:  You should plan  to take it easy for the rest of today and you should NOT DRIVE or use heavy machinery until tomorrow (because of the sedation medicines used during the test).    FOLLOW UP: Our staff will call the number listed on your records 48-72 hours following your procedure to check on you and address any questions or concerns that you may have regarding the information given to you following your procedure. If we do not reach you, we will leave a message.  We will attempt to reach you two times.  During this call, we will ask if you have developed any symptoms of COVID 19. If you develop any symptoms (ie: fever, flu-like symptoms, shortness of breath, cough etc.) before then, please call [phone].  If you test positive for Covid 19 in the 2 weeks post procedure, please call and report this information to [place].    If any biopsies were taken you will be contacted by phone or by letter within the next 1-3 weeks.  Please call us at [phone] if you have not heard about the biopsies in 3 weeks.   SIGNATURES/CONFIDENTIALITY: You and/or your care partner have signed paperwork which will be entered into your electronic medical record.  These signatures attest to the fact that that the information above on your After Visit Summary has been reviewed and is understood.  Full responsibility of the confidentiality of this discharge information lies with you and/or your care-partner.

## 2021-06-13 ENCOUNTER — Telehealth: Payer: Self-pay | Admitting: *Deleted

## 2021-06-13 NOTE — Telephone Encounter (Signed)
  Follow up Call-  Call back number 06/09/2021 05/17/2021  Post procedure Call Back phone  # [phone]  Permission to leave phone message Yes Yes  Some recent data might be hidden     Patient questions:  Do you have a fever, pain , or abdominal swelling? No. Pain Score  0 *  Have you tolerated food without any problems? Yes.    Have you been able to return to your normal activities? Yes.    Do you have any questions about your discharge instructions: Diet   No. Medications  No. Follow up visit  No.  Do you have questions or concerns about your Care? No.  Actions: * If pain score is 4 or above: No action needed, pain <4.

## 2021-06-15 ENCOUNTER — Encounter: Payer: Self-pay | Admitting: Gastroenterology

## 2021-07-24 ENCOUNTER — Telehealth: Payer: Self-pay | Admitting: *Deleted

## 2021-07-24 NOTE — Telephone Encounter (Signed)
PLEASE NOTE: All timestamps contained within this report are represented as [place] Standard Time. CONFIDENTIALTY NOTICE: This fax transmission is intended only for the addressee. It contains information that is legally privileged, confidential or otherwise protected from use or disclosure. If you are not the intended recipient, you are strictly prohibited from reviewing, disclosing, copying using or disseminating any of this information or taking any action in reliance on or regarding this information. If you have received this fax in error, please notify us immediately by telephone so that we can arrange for its return to [place]. Phone: [phone], Toll-Free: [phone], Fax: [phone] Page: 1 of 2 Call Id: [id number] [place] [facility] - Client TELEPHONE ADVICE RECORD AccessNurse Patient Name: Cameron Mcneil CDOWE Gender: Male DOB: 26-Jan-1966 Age: 55 Y 14 D Return Phone Number: [phone] (Primary) Address[place]  [address] Client [facility] - Client Client Site [place] Physician Renford Dills - MD Contact Type Call Who Is Calling Patient / Member / Family / Caregiver Call Type Triage / Clinical Relationship To Patient Self Return Phone Number [phone] (Primary) Chief Complaint Urine, Blood In Reason for Call Symptomatic / Request for [place] states he has blood in his urine and has frequency urination as well. Translation No Nurse Assessment Nurse: Altamease Oiler, RN, Adriana Date/Time (Eastern Time): 07/24/2021 3:52:32 PM Confirm and document reason for call. If symptomatic, describe symptoms. ---pt is c/o frequency with urination, blood in the urine yesterday. pt states that last week he had crystals in the urine. also reports burning with urination. Does the patient have any new or worsening symptoms? ---Yes Will a triage be completed?  ---Yes Related visit to physician within the last 2 weeks? ---No Does the PT have any chronic conditions? (i.e. diabetes, asthma, this includes High risk factors for pregnancy, etc.) ---Yes List chronic conditions. ---htn Is this a behavioral health or substance abuse call? ---No Guidelines Guideline Title Affirmed Question Affirmed Notes Nurse Date/Time Eilene Ghazi Time) Urination Pain - Male Artificial heart valve or artificial joint Altamease Oiler, RN, Adriana 07/24/2021 3:55:28 PM Disp. Time Eilene Ghazi Time) Disposition Final User 07/24/2021 3:40:09 PM FINAL ATTEMPT MADE - message left Altamease Oiler RN, Fabio Bering 07/24/2021 3:40:37 PM Attempt made - message left Sarajane Marek 07/24/2021 3:59:33 PM See HCP within 4 Hours (or PCP triage) Yes Altamease Oiler, RN, Adriana PLEASE NOTE: All timestamps contained within this report are represented as [place] Standard Time. CONFIDENTIALTY NOTICE: This fax transmission is intended only for the addressee. It contains information that is legally privileged, confidential or otherwise protected from use or disclosure. If you are not the intended recipient, you are strictly prohibited from reviewing, disclosing, copying using or disseminating any of this information or taking any action in reliance on or regarding this information. If you have received this fax in error, please notify us immediately by telephone so that we can arrange for its return to [place]. Phone: [phone], Toll-Free: [phone], Fax: [phone] Page: 2 of 2 Call Id: [id number] [place] Disagree/Comply Comply Caller Understands Yes PreDisposition Call Doctor Care Advice Given Per Guideline SEE HCP (OR PCP TRIAGE) WITHIN 4 HOURS: * IF OFFICE WILL BE OPEN: You need to be seen within the next 3 or 4 hours. Call your doctor (or NP/PA) now or as soon as the office opens. CALL BACK IF: * You become worse CARE ADVICE given per Urination Pain - Male (Adult) guideline. Referrals GO TO FACILITY UNDECIDED

## 2021-07-24 NOTE — Telephone Encounter (Signed)
Spoke to patient by telephone and was advised that he is in [place]. Patient stated that he is going to an urgent care in [place] that his girlfriend knows about.

## 2021-07-25 NOTE — Telephone Encounter (Signed)
Patient has an appt on 07/28/21 at 11am

## 2021-07-25 NOTE — Telephone Encounter (Signed)
Agreed.  Thanks.  Please call patient to get update on his situation later this week.

## 2021-07-28 ENCOUNTER — Ambulatory Visit: Payer: 59 | Admitting: Family Medicine

## 2021-10-23 ENCOUNTER — Encounter: Payer: Self-pay | Admitting: Family Medicine

## 2021-10-23 ENCOUNTER — Other Ambulatory Visit: Payer: Self-pay

## 2021-10-23 ENCOUNTER — Ambulatory Visit (INDEPENDENT_AMBULATORY_CARE_PROVIDER_SITE_OTHER)
Admission: RE | Admit: 2021-10-23 | Discharge: 2021-10-23 | Disposition: A | Discharge status: Home / Self Care | Payer: 59 | Source: Ambulatory Visit | Attending: Family Medicine | Admitting: Family Medicine

## 2021-10-23 ENCOUNTER — Ambulatory Visit (INDEPENDENT_AMBULATORY_CARE_PROVIDER_SITE_OTHER): Payer: 59 | Admitting: Family Medicine

## 2021-10-23 VITALS — BP 150/100 | HR 75 | Temp 97.3°F | Ht 74.0 in | Wt 279.0 lb

## 2021-10-23 DIAGNOSIS — R300 Dysuria: Secondary | ICD-10-CM | POA: Diagnosis not present

## 2021-10-23 DIAGNOSIS — E119 Type 2 diabetes mellitus without complications: Secondary | ICD-10-CM | POA: Diagnosis not present

## 2021-10-23 LAB — POC URINALSYSI DIPSTICK (AUTOMATED)
Bilirubin, UA: NEGATIVE
Glucose, UA: NEGATIVE
Ketones, UA: NEGATIVE
Leukocytes, UA: NEGATIVE
Nitrite, UA: NEGATIVE
Protein, UA: POSITIVE — AB
Spec Grav, UA: 1.02 (ref 1.010–1.025)
Urobilinogen, UA: 0.2 E.U./dL
pH, UA: 6 (ref 5.0–8.0)

## 2021-10-23 LAB — BASIC METABOLIC PANEL
BUN: 16 mg/dL (ref 6–23)
CO2: 25 mEq/L (ref 19–32)
Calcium: 9.6 mg/dL (ref 8.4–10.5)
Chloride: 101 mEq/L (ref 96–112)
Creatinine, Ser: 1.12 mg/dL (ref 0.40–1.50)
GFR: 75.12 mL/min (ref >60.00)
Glucose, Bld: 127 mg/dL — ABNORMAL HIGH (ref 70–99)
Potassium: 3.9 mEq/L (ref 3.5–5.1)
Sodium: 136 mEq/L (ref 135–145)

## 2021-10-23 LAB — HEMOGLOBIN A1C: Hgb A1c MFr Bld: 7.3 % — ABNORMAL HIGH (ref 4.6–6.5)

## 2021-10-23 MED ORDER — CEPHALEXIN 500 MG PO CAPS: 500.0000 mg | ORAL_CAPSULE | Freq: Three times a day (TID) | ORAL | 0 refills | Status: DC

## 2021-10-23 NOTE — Progress Notes (Signed)
This visit occurred during the SARS-CoV-2 public health emergency.  Safety protocols were in place, including screening questions prior to the visit, additional usage of staff PPE, and extensive cleaning of exam room while observing appropriate contact time as indicated for disinfecting solutions.  Diabetes:  No meds.   Hypoglycemic episodes: no sx Hyperglycemic episodes: no sx Feet problems: some occ tinging in the feet but that isn't new Blood Sugars averaging: not checked often eye exam within last year: done about 1 month ago, at Dr. Benna Dunks clinic.  See avs.    U/a d/w pt at [facility].  He had urgency about 3 weeks ago, seen at [facility], started on keflex.  He saw some crystals in his urine at the time.  He had burning with urination.  Was told he had blood in his urine at the time.  He saw blood in urine at the time.  He improved in the meantime, for a few weeks.  Now separate episode- has return of urgency and burning but not blood in urine.  Sx started about 1 week ago.  Drinking cranberry juice seemed to help, along with AZO.  No fevers.  Some occ upper vs lower abd discomfort, not limiting him.  Off abx currently.   H/o likely crystals in urine prev, in distant past.   Flu shot encouraged.  Declined by patient.    Meds, vitals, and allergies reviewed.  ROS: Per HPI unless specifically indicated in ROS section   GEN: nad, alert and oriented HEENT: ncat NECK: supple w/o LA CV: rrr. PULM: ctab, no inc wob ABD: soft, +bs EXT: no edema SKIN: no acute rash  Diabetic foot exam: Normal inspection No skin breakdown No calluses  Normal DP pulses Normal sensation to light touch and monofilament Nails normal  32 minutes were devoted to patient care in this encounter (this includes time spent reviewing the patient's file/history, interviewing and examining the patient, counseling/reviewing plan with patient).

## 2021-10-23 NOTE — Patient Instructions (Addendum)
Please ask the front to get a copy of your eye exam from [facility].  Go to the lab on the way out.   If you have mychart we'll likely use that to update you.    Take care.  Glad to see you. Start keflex in the meantime.  If not better in a few days or if worse then let me know.

## 2021-10-24 ENCOUNTER — Encounter: Payer: Self-pay | Admitting: Family Medicine

## 2021-10-24 LAB — URINE CULTURE
MICRO NUMBER:: 12683935
Result:: NO GROWTH
SPECIMEN QUALITY:: ADEQUATE

## 2021-10-25 ENCOUNTER — Other Ambulatory Visit: Payer: Self-pay | Admitting: Family Medicine

## 2021-10-25 DIAGNOSIS — R9389 Abnormal findings on diagnostic imaging of other specified body structures: Secondary | ICD-10-CM

## 2021-10-25 DIAGNOSIS — R300 Dysuria: Secondary | ICD-10-CM | POA: Insufficient documentation

## 2021-10-25 NOTE — Assessment & Plan Note (Addendum)
He noted that he had seen crystals in his urine previously.  Reasonable to check urine culture today.  Benign abdominal exam.  Check KUB to evaluate for stones.  Still okay for outpatient follow-up.  Urinalysis discussed with patient at office visit.  Start Keflex presumptively.  See notes on labs.

## 2021-10-25 NOTE — Assessment & Plan Note (Signed)
Due for recheck labs.  See notes on labs.  Discussed with patient about diet and exercise.

## 2021-11-07 ENCOUNTER — Ambulatory Visit
Admission: RE | Admit: 2021-11-07 | Discharge: 2021-11-07 | Disposition: A | Discharge status: Home / Self Care | Payer: 59 | Source: Ambulatory Visit | Attending: Family Medicine | Admitting: Family Medicine

## 2021-11-07 DIAGNOSIS — R9389 Abnormal findings on diagnostic imaging of other specified body structures: Secondary | ICD-10-CM

## 2021-11-09 ENCOUNTER — Encounter: Payer: Self-pay | Admitting: Family Medicine

## 2022-03-12 ENCOUNTER — Encounter: Payer: Self-pay | Admitting: Physician Assistant

## 2022-03-12 ENCOUNTER — Ambulatory Visit: Payer: 59 | Admitting: Physician Assistant

## 2022-03-12 ENCOUNTER — Ambulatory Visit (INDEPENDENT_AMBULATORY_CARE_PROVIDER_SITE_OTHER): Payer: 59

## 2022-03-12 VITALS — Ht 74.0 in | Wt 265.0 lb

## 2022-03-12 DIAGNOSIS — M25511 Pain in right shoulder: Secondary | ICD-10-CM | POA: Diagnosis not present

## 2022-03-12 MED ORDER — METHYLPREDNISOLONE ACETATE 40 MG/ML IJ SUSP
80.0000 mg | INTRAMUSCULAR | Status: AC | PRN
  Administered 2022-03-12: 80 mg via INTRA_ARTICULAR

## 2022-03-12 MED ORDER — LIDOCAINE HCL 1 % IJ SOLN
5.0000 mL | INTRAMUSCULAR | Status: AC | PRN
  Administered 2022-03-12: 5 mL

## 2022-03-12 NOTE — Progress Notes (Signed)
? ?Office Visit Note ?  ?Patient: Cameron Mcneil           ?Date of Birth: [birth year]           ?MRN: [id number] ?Visit Date: 03/12/2022 ?             ?Requested by: Tonia Ghent, MD ?[address] ?[place],   [id number] ?PCP: Tonia Ghent, MD ? ?Chief Complaint  ?Patient presents with  ? Right Shoulder - Pain  ? ? ? ? ?HPI: ?Patient is a pleasant 56 year old gentleman with a 3 to 4-week history of right shoulder pain.  He is active with weightlifting and states that he was doing a dumbbell press and felt a loud pop with accompanying pain in his right shoulder.  It is gotten a little bit better but he feels like his motion is not quite as good as it has been.  Certain things are also painful.  He is right-handed.  He is currently treating this symptomatically ? ?Assessment & Plan: ?Visit Diagnoses:  ?1. Acute pain of right shoulder   ? ? ?Plan: Exam most consistent with impingement findings.  Discussed the natural history of this.  I think it would be reasonable to go forward with a steroid injection today and he thinks he has had this in the past and done well.  I told him if he does not get relief from this and he has tried considering conservative treatment including rest rehabilitating on a self-directed program anti-inflammatories and an injection we can consider an MRI to rule out a rotator cuff tear ? ?Follow-Up Instructions: No follow-ups on file.  ? ?Ortho Exam ? ?Patient is alert, oriented, no adenopathy, well-dressed, normal affect, normal respiratory effort. ?Examination of his right shoulder he does have well-developed biceps.  Difficult to know if right is greater than left.  Does have some tenderness over the anterior shoulder.  He has some decreased internal rotation behind the back and go up to his mid back on the left and just above his belt line on the right.  Strength is 5 out of 5 with resisted abduction external and internal rotation.  He does have a positive empty can test  which produces pain.  No labral findings.  Negative speeds test.  No redness around the shoulder ? ?Imaging: ?XR Shoulder Right ? ?Result Date: 03/12/2022 ?Multiple radiographs of his right shoulder were taken today.  Humeral head is reduced in the glenoid he does have some sclerotic changes.  Most notable he does have degenerative changes of the [facility] joint with inferior osteophyte formation and calcification within the [facility] complex. No acute changes  ?No images are attached to the encounter. ? ?Labs: ?Lab Results  ?Component Value Date  ? HGBA1C 7.3 (H) 10/23/2021  ? HGBA1C 7.1 (H) 04/03/2021  ? HGBA1C 6.7 (H) 06/10/2020  ? ? ? ?Lab Results  ?Component Value Date  ? ALBUMIN 4.7 04/03/2021  ? ALBUMIN 4.7 06/10/2020  ? ALBUMIN 4.5 05/27/2019  ? ? ?No results found for: MG ?No results found for: VD25OH ? ?No results found for: PREALBUMIN ? ?  Latest Ref Rng & Units 04/03/2021  ? 10:22 AM 11/12/2013  ?  7:30 PM 11/12/2013  ?  3:06 PM  ?CBC EXTENDED  ?WBC 4.0 - 10.5 K/uL 7.2   10.9     ?RBC 4.22 - 5.81 Mil/uL 5.81   5.49     ?Hemoglobin 13.0 - 17.0 g/dL 16.2   14.8   16.3    ?  HCT 39.0 - 52.0 % 47.5   44.3   48.0    ?Platelets 150.0 - 400.0 K/uL 230.0   224     ?NEUT# 1.4 - 7.7 K/uL 5.3      ?Lymph# 0.7 - 4.0 K/uL 1.4      ? ? ? ?Body mass index is 34.02 kg/m?. ? ?Orders:  ?Orders Placed This Encounter  ?Procedures  ? XR Shoulder Right  ? ?No orders of the defined types were placed in this encounter. ? ? ? Procedures: ?Large Joint Inj: R subacromial bursa on 03/12/2022 2:26 PM ?Indications: diagnostic evaluation and pain ?Details: 25 G 1.5 in needle, posterior approach ? ?Arthrogram: No ? ?Medications: 5 mL lidocaine 1 %; 80 mg methylPREDNISolone acetate 40 MG/ML ?Outcome: tolerated well, no immediate complications ?Procedure, treatment alternatives, risks and benefits explained, specific risks discussed. Consent was given by the patient.  ? ? ? ?Clinical Data: ?No additional findings. ? ?ROS: ? ?All other systems negative,  except as noted in the HPI. ?Review of Systems ? ?Objective: ?Vital Signs: Ht '6\' 2"'$  (1.88 m)   Wt 265 lb (120.2 kg)   BMI 34.02 kg/m?  ? ?Specialty Comments:  ?No specialty comments available. ? ?PMFS History: ?Patient Active Problem List  ? Diagnosis Date Noted  ? Dysuria 10/25/2021  ? Routine general medical examination at a health care facility 06/24/2019  ? Diabetes mellitus without complication ([place]) 15/72/6203  ? Hyperglycemia 04/13/2017  ? Benign essential hypertension 01/22/2017  ? HLD (hyperlipidemia) 08/24/2015  ? Advance care planning 08/24/2015  ? Obesity 02/21/2013  ? Paresthesia of bilateral legs 02/19/2013  ? Hypogonadism in male 01/10/2009  ? ERECTILE DYSFUNCTION 12/30/2008  ? HTN (hypertension) 12/30/2008  ? ?Past Medical History:  ?Diagnosis Date  ? Allergic rhinitis   ? Diabetes mellitus without complication ([place])   ? pre-diabetic =no meds per pt  ? ED (erectile dysfunction)   ? Hyperlipidemia   ? Hypertension   ? Hypogonadism male   ?  ?Family History  ?Problem Relation Age of Onset  ? Diabetes Other   ? Prostate cancer Neg Hx   ? Colon cancer Neg Hx   ? Colon polyps Neg Hx   ? Esophageal cancer Neg Hx   ? Rectal cancer Neg Hx   ? Stomach cancer Neg Hx   ?  ?Past Surgical History:  ?Procedure Laterality Date  ? COLONOSCOPY  05/17/2021  ? poor prep- repeated on 06/09/21 Dr Ardis Hughs  ? ORIF ANKLE FRACTURE Right 11/12/2013  ? Procedure: OPEN REDUCTION INTERNAL FIXATION (ORIF) ANKLE FRACTURE/ irrigation and debridement of right forefoot wound  dehisence;  Surgeon: Wylene Simmer, MD;  Location: [facility];  Service: Orthopedics;  Laterality: Right;  ? TOE SURGERY Right 11/06/2013  ? [place]  ? ?Social History  ? ?Occupational History  ? 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  Employer: [employer]  ?Tobacco Use  ? Smoking status: Never  ? Smokeless tobacco: Never  ?Vaping Use  ? Vaping Use: Never used  ?Substance and Sexual Activity  ?  Alcohol use: Yes  ?  Comment: occ  ? Drug use: No  ? Sexual activity: Yes  ?  Partners: Female  ? ? ? ? ? ?

## 2022-03-19 ENCOUNTER — Telehealth: Payer: Self-pay | Admitting: Family Medicine

## 2022-03-19 DIAGNOSIS — R300 Dysuria: Secondary | ICD-10-CM

## 2022-03-19 NOTE — Telephone Encounter (Signed)
Kim calling to get a  ?Referral to Urology in [place] ? ?Requesting [place] ?Re: urgency incontinence and discomfort in penis area ? ? ? ? ?

## 2022-03-20 NOTE — Telephone Encounter (Signed)
Left v/m requesting cb for triage.sending note to [place] triage. ?

## 2022-03-20 NOTE — Telephone Encounter (Signed)
Referral placed but please triage patient.  Thanks.  ?

## 2022-03-20 NOTE — Telephone Encounter (Signed)
Still unable to reach pt by phone. Will wait for cb. ?

## 2022-03-21 NOTE — Telephone Encounter (Signed)
I spoke with pt;pt said on and off for last several weeks has had burning and pain upon urination; feeling of urgency and frequency; pt has not seen any blood and no fever.pt has not had any abd or back pain. Pt said his flow has been normal. A couple of times pt did dribble incontinently. Pt does have appt with urology on 04/04/22. Offered pt an appt for today at [facility] but did not fit pts schedule and Pt scheduled appt with T Dugal FNP on 03/22/22 at 12 noon. UC & ED precautions given and pt voiced understanding. Sending note to Red Christians FNP and Dr Elliot Gurney as PCP. ?

## 2022-03-21 NOTE — Telephone Encounter (Signed)
Agree with precautions given to pt  Agree with nurse assessment in plan.  Thank you for speaking with them. 

## 2022-03-21 NOTE — Telephone Encounter (Signed)
Noted. Thanks.

## 2022-03-22 ENCOUNTER — Encounter: Payer: Self-pay | Admitting: Family

## 2022-03-22 ENCOUNTER — Ambulatory Visit (INDEPENDENT_AMBULATORY_CARE_PROVIDER_SITE_OTHER): Payer: 59 | Admitting: Family

## 2022-03-22 ENCOUNTER — Other Ambulatory Visit: Payer: Self-pay

## 2022-03-22 ENCOUNTER — Telehealth: Payer: Self-pay | Admitting: Physician Assistant

## 2022-03-22 VITALS — BP 142/90 | HR 76 | Temp 98.3°F | Resp 16 | Ht 74.0 in | Wt 261.1 lb

## 2022-03-22 DIAGNOSIS — E119 Type 2 diabetes mellitus without complications: Secondary | ICD-10-CM

## 2022-03-22 DIAGNOSIS — N3001 Acute cystitis with hematuria: Secondary | ICD-10-CM | POA: Insufficient documentation

## 2022-03-22 DIAGNOSIS — R300 Dysuria: Secondary | ICD-10-CM

## 2022-03-22 DIAGNOSIS — R339 Retention of urine, unspecified: Secondary | ICD-10-CM | POA: Insufficient documentation

## 2022-03-22 DIAGNOSIS — N400 Enlarged prostate without lower urinary tract symptoms: Secondary | ICD-10-CM | POA: Insufficient documentation

## 2022-03-22 DIAGNOSIS — M25511 Pain in right shoulder: Secondary | ICD-10-CM

## 2022-03-22 DIAGNOSIS — I10 Essential (primary) hypertension: Secondary | ICD-10-CM

## 2022-03-22 DIAGNOSIS — R319 Hematuria, unspecified: Secondary | ICD-10-CM | POA: Diagnosis not present

## 2022-03-22 DIAGNOSIS — R801 Persistent proteinuria, unspecified: Secondary | ICD-10-CM | POA: Insufficient documentation

## 2022-03-22 LAB — URINALYSIS, ROUTINE W REFLEX MICROSCOPIC
Bilirubin Urine: NEGATIVE
Ketones, ur: NEGATIVE
Leukocytes,Ua: NEGATIVE
Nitrite: NEGATIVE
Specific Gravity, Urine: 1.025 (ref 1.000–1.030)
Total Protein, Urine: 30 — AB
Urine Glucose: NEGATIVE
Urobilinogen, UA: 0.2 (ref 0.0–1.0)
pH: 6 (ref 5.0–8.0)

## 2022-03-22 LAB — BASIC METABOLIC PANEL
BUN: 13 mg/dL (ref 6–23)
CO2: 28 mEq/L (ref 19–32)
Calcium: 9.5 mg/dL (ref 8.4–10.5)
Chloride: 100 mEq/L (ref 96–112)
Creatinine, Ser: 1.05 mg/dL (ref 0.40–1.50)
GFR: 80.93 mL/min (ref >60.00)
Glucose, Bld: 116 mg/dL — ABNORMAL HIGH (ref 70–99)
Potassium: 4.2 mEq/L (ref 3.5–5.1)
Sodium: 137 mEq/L (ref 135–145)

## 2022-03-22 LAB — POC URINALSYSI DIPSTICK (AUTOMATED)
Bilirubin, UA: NEGATIVE
Glucose, UA: NEGATIVE
Ketones, UA: POSITIVE
Nitrite, UA: NEGATIVE
Protein, UA: POSITIVE — AB
Spec Grav, UA: 1.02 (ref 1.010–1.025)
Urobilinogen, UA: NEGATIVE E.U./dL — AB
pH, UA: 5.5 (ref 5.0–8.0)

## 2022-03-22 LAB — PSA: PSA: 0.52 ng/mL (ref 0.10–4.00)

## 2022-03-22 LAB — HEMOGLOBIN A1C: Hgb A1c MFr Bld: 6.7 % — ABNORMAL HIGH (ref 4.6–6.5)

## 2022-03-22 MED ORDER — CIPROFLOXACIN HCL 500 MG PO TABS: 500.0000 mg | ORAL_TABLET | Freq: Two times a day (BID) | ORAL | 0 refills | Status: AC

## 2022-03-22 MED ORDER — AMLODIPINE BESYLATE 5 MG PO TABS
5.0000 mg | ORAL_TABLET | Freq: Every day | ORAL | 1 refills | Status: DC
Start: 2022-03-22 — End: 2022-08-31

## 2022-03-22 NOTE — Assessment & Plan Note (Signed)
Ordering urinalysis pending results ?US renal to r/o kidney stone however no flank pain/ no abdominal suprapubic pain ?

## 2022-03-22 NOTE — Patient Instructions (Addendum)
Start amlodipine 5 mg once daily, and stop losartan HCTZ.  ?Start monitoring your blood pressure daily, around the same time of day, for the next 2-3 weeks.  Ensure that you have rested for 30 minutes prior to checking your blood pressure. Record your readings and bring them to your next visit. ? ?Sending in antibiotic to start while we wait for the results of the urine and lab tests.  ? ?Stop by the lab prior to leaving today. I will notify you of your results once received.  ? ?Due to recent changes in healthcare laws, you may see results of your imaging and/or laboratory studies on MyChart before I have had a chance to review them.  I understand that in some cases there may be results that are confusing or concerning to you. Please understand that not all results are received at the same time and often I may need to interpret multiple results in order to provide you with the best plan of care or course of treatment. Therefore, I ask that you please give me 2 business days to thoroughly review all your results before contacting my office for clarification. Should we see a critical lab result, you will be contacted sooner.  ? ?It was a pleasure seeing you today! Please do not hesitate to reach out with any questions and or concerns. ? ?Regards,  ? ?Laloni Rowton ?FNP-C ? ?

## 2022-03-22 NOTE — Assessment & Plan Note (Signed)
Pt self d/c his HCTZ losartan due to urinating frequently ?Start amlodipine 5 mg once daily  ?Pt advised of the following:  ?Continue medication as prescribed. Monitor blood pressure periodically and/or when you feel symptomatic. Goal is <130/90 on average. Ensure that you have rested for 30 minutes prior to checking your blood pressure. Record your readings and bring them to your next visit if necessary.work on a low sodium diet. ? ?

## 2022-03-22 NOTE — Assessment & Plan Note (Signed)
Ordering urine GC  ?poct urine with hematuria, ordering urinalysis and urine culture pending results ?

## 2022-03-22 NOTE — Telephone Encounter (Signed)
Order made. They will contact him to schedule. ? ?

## 2022-03-22 NOTE — Progress Notes (Signed)
? ?Established Patient Office Visit ? ?Subjective:  ?Patient ID: Cameron Mcneil, male    DOB: Aug 12, 1966  Age: 56 y.o. MRN: [id number] ? ?CC:  ?Chief Complaint  ?Patient presents with  ? Urinary Frequency  ?  Burning X several months does not happen all the time.  ? ? ?HPI ?Cameron Mcneil is here today with concerns.  ? ?Urinary frequency and urgency started about one month ago. Went to doctor, was given antbx and improved.  ? ?In the last two weeks, again started to have frequency and urgency. States symptoms seem to be sporadic. Has had dysuria sporadically as well, but not in the last two days. Started to juice more and taking in more beet juice in hopes of improvement. Did d/c his blood pressure medication  ? ?No penile discharge.  ?No testicular or scrotal pain.  ?Has been with girlfriend for 3.5 years, doesn't feel like std risk.  ?Review of urine in the past does have protein in the urine. ?No flank pain.   ?Lab Results  ?Component Value Date  ? PSA 0.64 04/03/2021  ? PSA 0.68 06/10/2020  ? PSA 0.51 05/27/2019  ? ? ?US renal 11/07/21: did show kidney stone non-obstructing, simple right renal stone and did have prostatomegaly. Did make appt with urology 5/10 for consult.  ? ?HTN: at home when he checks it higher 130's usually 90's. Has since stopped his losartan hctz one week ago bc of urinary frequency. No sob cp and or palp  ? ?Past Medical History:  ?Diagnosis Date  ? Allergic rhinitis   ? Diabetes mellitus without complication ([place])   ? pre-diabetic =no meds per pt  ? ED (erectile dysfunction)   ? Hyperlipidemia   ? Hypertension   ? Hypogonadism male   ? ? ?Past Surgical History:  ?Procedure Laterality Date  ? COLONOSCOPY  05/17/2021  ? poor prep- repeated on 06/09/21 Dr Ardis Hughs  ? ORIF ANKLE FRACTURE Right 11/12/2013  ? Procedure: OPEN REDUCTION INTERNAL FIXATION (ORIF) ANKLE FRACTURE/ irrigation and debridement of right forefoot wound  dehisence;  Surgeon: Wylene Simmer, MD;  Location: [facility];  Service:  Orthopedics;  Laterality: Right;  ? TOE SURGERY Right 11/06/2013  ? [place]  ? ? ?Family History  ?Problem Relation Age of Onset  ? Diabetes Other   ? Prostate cancer Neg Hx   ? Colon cancer Neg Hx   ? Colon polyps Neg Hx   ? Esophageal cancer Neg Hx   ? Rectal cancer Neg Hx   ? Stomach cancer Neg Hx   ? ? ?Social History  ? ?Socioeconomic History  ? Marital status: Single  ?  Spouse name: Not on file  ? Number of children: Not on file  ? Years of education: 34  ? Highest education level: Not on file  ?Occupational History  ? 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  Employer: [employer]  ?Tobacco Use  ? Smoking status: Never  ? Smokeless tobacco: Never  ?Vaping Use  ? Vaping Use: Never used  ?Substance and Sexual Activity  ? Alcohol use: Yes  ?  Comment: occ  ? Drug use: No  ? Sexual activity: Yes  ?  Partners: Female  ?Other Topics Concern  ? Not on file  ?Social History Narrative  ? [employer], [employer] Graduate. Single but in relationship. Work - Administrator, Therapist, sports.  ?  2 kids  ? ?Social Determinants of Health  ? ?Financial Resource Strain: Not on file  ?Food Insecurity: Not on file  ?Transportation Needs: Not on file  ?Physical Activity: Not on file  ?Stress: Not on file  ?Social Connections: Not on file  ?Intimate Partner Violence: Not on file  ? ? ?Outpatient Medications Prior to Visit  ?Medication Sig Dispense Refill  ? Multiple Vitamin (MULTIVITAMIN) tablet Take 1 tablet by mouth daily.    ? losartan-hydrochlorothiazide (HYZAAR) 100-25 MG tablet Take 1 tablet by mouth daily. 90 tablet 3  ? ?No facility-administered medications prior to visit.  ? ? ?No Known Allergies ? ?ROS ?Review of Systems  ?Constitutional:  Negative for chills, fatigue, fever and unexpected weight change.  ?Eyes:  Negative for visual disturbance.  ?Respiratory:  Negative for shortness of breath.   ?Cardiovascular:  Negative for chest pain.   ?Gastrointestinal:  Negative for abdominal pain.  ?Genitourinary:  Positive for difficulty urinating (retention), frequency and urgency. Negative for decreased urine volume, flank pain, hematuria, penile discharge, penile pain, penile swelling, scrotal swelling and testicular pain.  ?Skin:  Negative for rash.  ?Neurological:  Negative for dizziness and headaches.  ? ?  ?Objective:  ?  ?Physical Exam ?Constitutional:   ?   General: He is not in acute distress. ?   Appearance: Normal appearance. He is obese. He is not ill-appearing, toxic-appearing or diaphoretic.  ?Cardiovascular:  ?   Rate and Rhythm: Normal rate and regular rhythm.  ?Pulmonary:  ?   Effort: Pulmonary effort is normal.  ?Abdominal:  ?   General: Abdomen is flat. There is no distension.  ?   Tenderness: There is no abdominal tenderness.  ?Neurological:  ?   General: No focal deficit present.  ?   Mental Status: He is alert and oriented to person, place, and time. Mental status is at baseline.  ?Psychiatric:     ?   Mood and Affect: Mood normal.     ?   Behavior: Behavior normal.     ?   Thought Content: Thought content normal.     ?   Judgment: Judgment normal.  ? ? ?BP (!) 142/90   Pulse 76   Temp 98.3 ?F (36.8 ?C)   Resp 16   Ht '6\' 2"'$  (1.88 m)   Wt 261 lb 1 oz (118.4 kg)   SpO2 92%   BMI 33.52 kg/m?  ?Wt Readings from Last 3 Encounters:  ?03/22/22 261 lb 1 oz (118.4 kg)  ?03/12/22 265 lb (120.2 kg)  ?10/23/21 279 lb (126.6 kg)  ? ? ? ?Health Maintenance Due  ?Topic Date Due  ? OPHTHALMOLOGY EXAM  Never done  ? Zoster Vaccines- Shingrix (1 of 2) Never done  ? COVID-19 Vaccine (3 - Booster for Pfizer series) 10/19/2020  ? URINE MICROALBUMIN  06/10/2021  ? ? ?There are no preventive care reminders to display for this patient. ? ?Lab Results  ?Component Value Date  ? TSH 2.71 02/19/2013  ? ?Lab Results  ?Component Value Date  ? WBC 7.2 04/03/2021  ? HGB 16.2 04/03/2021  ? HCT 47.5 04/03/2021  ? MCV 81.6 04/03/2021  ? PLT 230.0 04/03/2021   ? ?Lab Results  ?Component Value Date  ? NA 136 10/23/2021  ? K 3.9 10/23/2021  ? CO2 25 10/23/2021  ? GLUCOSE 127 (H) 10/23/2021  ? BUN 16 10/23/2021  ? CREATININE 1.12 10/23/2021  ? BILITOT 0.6 04/03/2021  ? ALKPHOS 33 (L) 04/03/2021  ? AST 19 04/03/2021  ?  ALT 25 04/03/2021  ? PROT 7.4 04/03/2021  ? ALBUMIN 4.7 04/03/2021  ? CALCIUM 9.6 10/23/2021  ? GFR 75.12 10/23/2021  ? ?Lab Results  ?Component Value Date  ? HGBA1C 7.3 (H) 10/23/2021  ? ? ?  ?Assessment & Plan:  ? ?Problem List Items Addressed This Visit   ? ?  ? Cardiovascular and Mediastinum  ? Benign essential hypertension  ?  Pt self d/c his HCTZ losartan due to urinating frequently ?Start amlodipine 5 mg once daily  ?Pt advised of the following:  ?Continue medication as prescribed. Monitor blood pressure periodically and/or when you feel symptomatic. Goal is <130/90 on average. Ensure that you have rested for 30 minutes prior to checking your blood pressure. Record your readings and bring them to your next visit if necessary.work on a low sodium diet. ? ?  ?  ? Relevant Medications  ? amLODipine (NORVASC) 5 MG tablet  ?  ? Endocrine  ? Diabetes mellitus without complication ([place])  ?  Ordering a1c for urinary frequency  ?Pending results ? ?  ?  ? Relevant Orders  ? Hemoglobin A1c  ?  ? Genitourinary  ? Urinary retention - Primary  ? Relevant Orders  ? POCT Urinalysis Dipstick (Automated) (Completed)  ? Urine Culture  ? Urinalysis, Routine w reflex microscopic  ? US RENAL  ? C. trachomatis/N. gonorrhoeae RNA  ? Acute cystitis with hematuria  ?  Prostatitis vs uti however with hematuria, treating with ciprofloxacin which will treat both.  ?antbx sent to pharmacy, pt to take as directed. Encouraged increased water intake throughout the day. Urine culture/reflex pending results. Choosing to treat due to being symptomatic. If no improvement in the next 2 days pt advised to let me know. ? ? ?  ?  ? Relevant Medications  ? ciprofloxacin (CIPRO) 500 MG tablet  ?  Other Relevant Orders  ? C. trachomatis/N. gonorrhoeae RNA  ?  ? Other  ? Dysuria  ?  Ordering urine GC  ?poct urine with hematuria, ordering urinalysis and urine culture pending results ? ?  ?  ? Relevant Medications  ? ciprofloxacin

## 2022-03-22 NOTE — Assessment & Plan Note (Signed)
Ordering a1c for urinary frequency  ?Pending results ?

## 2022-03-22 NOTE — Assessment & Plan Note (Addendum)
Prostatitis vs uti however with hematuria, treating with ciprofloxacin which will treat both.  ?antbx sent to pharmacy, pt to take as directed. Encouraged increased water intake throughout the day. Urine culture/reflex pending results. Choosing to treat due to being symptomatic. If no improvement in the next 2 days pt advised to let me know. ? ?

## 2022-03-22 NOTE — Progress Notes (Signed)
Treating for prostatitis vs uti with cipro. Pending results of urinalysis and urine culture as well as psa cbc

## 2022-03-22 NOTE — Telephone Encounter (Signed)
OK for MRI right shoulder with contrast

## 2022-03-22 NOTE — Telephone Encounter (Signed)
Pt called requesting a referral be sent to MRI of right shoulder. Please call pt at [phone]. ?

## 2022-03-22 NOTE — Assessment & Plan Note (Signed)
Ordering psa pending results.  ?Pt has appt already scheduled with urologist.  ?

## 2022-03-23 LAB — URINE CULTURE
MICRO NUMBER:: 13321076
Result:: NO GROWTH
SPECIMEN QUALITY:: ADEQUATE

## 2022-03-26 ENCOUNTER — Telehealth: Payer: Self-pay

## 2022-03-26 NOTE — Telephone Encounter (Signed)
[facility] - Client ?Nonclinical Telephone Record  ?[facility]? ?Client [facility] - Client ?Client Site [facility] ?Provider Renford Dills - MD ?Contact Type Call ?Who Is Calling Patient / Member / Family / Caregiver ?Caller Name Derick Henery ?Caller Phone Number [phone] ?Patient Name Cameron Mcneil ?Patient DOB 1966/03/13 ?Call Type Message Only Information Provided ?Reason for Call Request for General Office Information ?Initial Comment Caller states, dropped urine sample- cant drop it today. Declined rn ?Disp. Time Disposition Final User ?03/24/2022 7:14:53 AM General Information Provided Yes Lonia Farber ?Call Closed By: Lonia Farber ?Transaction Date/Time: 03/24/2022 7:13:00 AM (ET ?

## 2022-03-27 ENCOUNTER — Encounter: Payer: Self-pay | Admitting: *Deleted

## 2022-03-28 ENCOUNTER — Other Ambulatory Visit: Payer: Self-pay | Admitting: Orthopaedic Surgery

## 2022-03-28 DIAGNOSIS — M25511 Pain in right shoulder: Secondary | ICD-10-CM

## 2022-04-04 ENCOUNTER — Ambulatory Visit: Payer: 59 | Admitting: Urology

## 2022-04-04 NOTE — Progress Notes (Incomplete)
? ?04/04/2022 ?5:22 AM  ? ?Cameron Mcneil ?[birth year] ?[id number] ? ?Referring provider:  ?Tonia Ghent, MD ?[address] ?[place],  [place] [id number] ?No chief complaint on file. ? ? ? ? ?HPI: ?Cameron Mcneil is a 56 y.o.male who presents today for dysuria. ? ?He was seen on 03/22/2022 by his PCP At the time he was noted to have urinary frequency , dysuria, and urgency that had been ongoing for 2 weeks intermittently. Urinalysis showed small hgb, 7-10 WBCs, 7-10 RBCs, and rare epithelial. Urine culture had no growth.  ? ?He underwent a RUS ON 11/07/2021 that visualized bilateral subcentimeter nonobstructing renal calculi, a simple renal cyst, and prostatomegaly.  ? ? ? ?PMH: ?Past Medical History:  ?Diagnosis Date  ? Allergic rhinitis   ? Diabetes mellitus without complication ([facility] [facility])   ? pre-diabetic =no meds per pt  ? ED (erectile dysfunction)   ? Hyperlipidemia   ? Hypertension   ? Hypogonadism male   ? ? ?Surgical History: ?Past Surgical History:  ?Procedure Laterality Date  ? COLONOSCOPY  05/17/2021  ? poor prep- repeated on 06/09/21 Dr Ardis Hughs  ? ORIF ANKLE FRACTURE Right 11/12/2013  ? Procedure: OPEN REDUCTION INTERNAL FIXATION (ORIF) ANKLE FRACTURE/ irrigation and debridement of right forefoot wound  dehisence;  Surgeon: Wylene Simmer, MD;  Location: [facility];  Service: Orthopedics;  Laterality: Right;  ? TOE SURGERY Right 11/06/2013  ? [place]  ? ? ?Home Medications:  ?Allergies as of 04/04/2022   ?No Known Allergies ?  ? ?  ?Medication List  ?  ? ?  ? Accurate as of Apr 04, 2022  5:22 AM. If you have any questions, ask your nurse or doctor.  ?  ?  ? ?  ? ?amLODipine 5 MG tablet ?Commonly known as: NORVASC ?Take 1 tablet (5 mg total) by mouth daily. ?  ?multivitamin tablet ?Take 1 tablet by mouth daily. ?  ? ?  ? ? ?Allergies:  ?No Known Allergies ? ?Family History: ?Family History  ?Problem Relation Age of Onset  ? Diabetes Other   ? Prostate cancer Neg Hx   ? Colon cancer Neg Hx    ? Colon polyps Neg Hx   ? Esophageal cancer Neg Hx   ? Rectal cancer Neg Hx   ? Stomach cancer Neg Hx   ? ? ?Social History:  reports that he has never smoked. He has never used smokeless tobacco. He reports current alcohol use. He reports that he does not use drugs. ? ? ?Physical Exam: ?There were no vitals taken for this visit.  ?Constitutional:  Alert and oriented, No acute distress. ?HEENT: [place] AT, moist mucus membranes.  Trachea midline, no masses. ?Cardiovascular: No clubbing, cyanosis, or edema. ?Respiratory: Normal respiratory effort, no increased work of breathing. ?Skin: No rashes, bruises or suspicious lesions. ?Neurologic: Grossly intact, no focal deficits, moving all 4 extremities. ?Psychiatric: Normal mood and affect. ? ?Laboratory Data: ? ?Lab Results  ?Component Value Date  ? CREATININE 1.05 03/22/2022  ? ?Lab Results  ?Component Value Date  ? HGBA1C 6.7 (H) 03/22/2022  ? ? ?Urinalysis ? ? ?Pertinent Imaging: ? ? ? ?Assessment & Plan:   ? ? ?No follow-ups on file. ? ?I,Kailey Littlejohn,acting as a scribe for Hollice Espy, MD.,have documented all relevant documentation on the behalf of Hollice Espy, MD,as directed by  Hollice Espy, MD while in the presence of  Hollice Espy, MD. ? ? ? ?[address], [address] ?[place], [place] [address] ?[phone] ?

## 2022-04-06 ENCOUNTER — Encounter: Payer: Self-pay | Admitting: Urology

## 2022-04-16 ENCOUNTER — Ambulatory Visit (HOSPITAL_COMMUNITY)
Admission: RE | Admit: 2022-04-16 | Discharge: 2022-04-16 | Disposition: A | Discharge status: Home / Self Care | Payer: 59 | Source: Ambulatory Visit | Attending: Orthopaedic Surgery | Admitting: Orthopaedic Surgery

## 2022-04-16 ENCOUNTER — Ambulatory Visit (HOSPITAL_COMMUNITY)
Admission: RE | Admit: 2022-04-16 | Discharge: 2022-04-16 | Disposition: A | Discharge status: Home / Self Care | Payer: 59 | Source: Ambulatory Visit | Attending: Family | Admitting: Family

## 2022-04-16 DIAGNOSIS — M25511 Pain in right shoulder: Secondary | ICD-10-CM | POA: Diagnosis present

## 2022-04-16 DIAGNOSIS — R319 Hematuria, unspecified: Secondary | ICD-10-CM | POA: Diagnosis not present

## 2022-04-16 DIAGNOSIS — R339 Retention of urine, unspecified: Secondary | ICD-10-CM | POA: Diagnosis not present

## 2022-04-16 DIAGNOSIS — M7521 Bicipital tendinitis, right shoulder: Secondary | ICD-10-CM | POA: Diagnosis not present

## 2022-04-16 DIAGNOSIS — M75121 Complete rotator cuff tear or rupture of right shoulder, not specified as traumatic: Secondary | ICD-10-CM | POA: Diagnosis not present

## 2022-04-16 DIAGNOSIS — R801 Persistent proteinuria, unspecified: Secondary | ICD-10-CM | POA: Insufficient documentation

## 2022-04-16 DIAGNOSIS — M19011 Primary osteoarthritis, right shoulder: Secondary | ICD-10-CM | POA: Diagnosis not present

## 2022-04-16 DIAGNOSIS — N400 Enlarged prostate without lower urinary tract symptoms: Secondary | ICD-10-CM | POA: Diagnosis not present

## 2022-04-16 MED ORDER — GADOBUTROL 1 MMOL/ML IV SOLN
0.0500 mL | Freq: Once | INTRAVENOUS | Status: AC | PRN
  Administered 2022-04-16: 0.05 mL

## 2022-04-16 MED ORDER — SODIUM CHLORIDE (PF) 0.9 % IJ SOLN
10.0000 mL | Freq: Once | INTRAMUSCULAR | Status: AC
  Administered 2022-04-16: 10 mL

## 2022-04-16 MED ORDER — LIDOCAINE HCL (PF) 1 % IJ SOLN
5.0000 mL | Freq: Once | INTRAMUSCULAR | Status: AC
  Administered 2022-04-16: 5 mL via INTRADERMAL

## 2022-04-16 MED ORDER — IOHEXOL 180 MG/ML  SOLN
10.0000 mL | Freq: Once | INTRAMUSCULAR | Status: AC | PRN
  Administered 2022-04-16: 10 mL via INTRA_ARTICULAR

## 2022-04-16 NOTE — Procedures (Signed)
PROCEDURE SUMMARY:  Successful fluoroscopic guided right shoulder arthrogram.  No immediate complications.  Pt tolerated well.   EBL = <1 ml  Please see full dictation in imaging section of Epic for procedure details.    Narda Rutherford, AGNP-BC 04/16/2022, 1:46 PM

## 2022-04-18 ENCOUNTER — Ambulatory Visit: Payer: 59 | Admitting: Orthopaedic Surgery

## 2022-04-18 ENCOUNTER — Encounter: Payer: Self-pay | Admitting: Orthopaedic Surgery

## 2022-04-18 DIAGNOSIS — M25511 Pain in right shoulder: Secondary | ICD-10-CM

## 2022-04-18 DIAGNOSIS — M75111 Incomplete rotator cuff tear or rupture of right shoulder, not specified as traumatic: Secondary | ICD-10-CM

## 2022-04-18 NOTE — Progress Notes (Signed)
Office Visit Note   Patient: Cameron Mcneil           Date of Birth: 1966-03-25           MRN: [id number] Visit Date: 04/18/2022              Requested by: Tonia Ghent, MD [address] [place],  [place] [address] PCP: Tonia Ghent, MD   Assessment & Plan: Visit Diagnoses: Right shoulder pain  Plan: Patient is a 55 year old gentleman who presents today with continued pain in his right shoulder.  This is been going on for a couple months after injuring it when weightlifting.  He has tried an injection previously and while it began to get better he was doing some work and had return of the shoulder pain.  He does a lot of work overhead and it is bothering him.  MRI was ordered and we reviewed this with him today.  MRI demonstrated a high-grade bursal sided tear of the supraspinatus with retraction of the bursal sided fibers measuring approximately 2.4 cm there is contrast from the arthrogram through the infraspinatus tendon which almost certainly represented high-grade articular sided tearing with full-thickness perforation teres minor and subscapularis tendons are intact.  We went over the anatomy of where his pain is it certainly coordinates to where these tears are.  An extensive conversation was discussed about conservative versus surgical treatment.  From a surgical perspective we recommend a subacromial decompression excision distal clavicle and mini open rotator cuff repair.  Discussed the recovery length of surgery and expected outcomes.  Because the tear is small we also discussed possibly going forward with physical therapy to see if he sees improvement.  After an extensive discussion he would like to try physical therapy first.  If he does not have improvement he may call to schedule surgery  Follow-Up Instructions: No follow-ups on file.   Orders:  No orders of the defined types were placed in this encounter.  No orders of the defined types were placed in this  encounter.     Procedures: No procedures performed   Clinical Data: No additional findings.   Subjective: No chief complaint on file. Patient presents today for follow up on his right shoulder. He had a right shoulder arthrogram and is here today for those results.  He does say that he had a recent reinjury to the shoulder and the shoulder is now painful especially with overhead motion    Review of Systems  All other systems reviewed and are negative.   Objective: Vital Signs: There were no vitals taken for this visit.  Physical Exam Constitutional:      Appearance: Normal appearance.  Pulmonary:     Effort: Pulmonary effort is normal.  Neurological:     Mental Status: He is alert.  Psychiatric:        Mood and Affect: Mood normal.        Behavior: Behavior normal.    Ortho Exam Examination of his right shoulder he does have pain when raising his arm overhead.  He has a negative drop arm test.  He has positive findings with impingement testing.  Some pain with internal rotation behind his back strength is overall intact though limited by pain today.  Distal circulation is intact Specialty Comments:  No specialty comments available.  Imaging: No results found.   PMFS History: Patient Active Problem List   Diagnosis Date Noted   Hematuria 03/22/2022   Prostate enlargement  03/22/2022   Urinary retention 03/22/2022   Acute cystitis with hematuria 03/22/2022   Persistent proteinuria 03/22/2022   Dysuria 10/25/2021   Diabetes mellitus without complication ([facility]) 53/61/4431   Benign essential hypertension 01/22/2017   HLD (hyperlipidemia) 08/24/2015   Advance care planning 08/24/2015   Obesity 02/21/2013   Paresthesia of bilateral legs 02/19/2013   Hypogonadism in male 01/10/2009   ERECTILE DYSFUNCTION 12/30/2008   Past Medical History:  Diagnosis Date   Allergic rhinitis    Diabetes mellitus without complication ([facility])    pre-diabetic =no meds per pt   ED  (erectile dysfunction)    Hyperlipidemia    Hypertension    Hypogonadism male     Family History  Problem Relation Age of Onset   Diabetes Other    Prostate cancer Neg Hx    Colon cancer Neg Hx    Colon polyps Neg Hx    Esophageal cancer Neg Hx    Rectal cancer Neg Hx    Stomach cancer Neg Hx     Past Surgical History:  Procedure Laterality Date   COLONOSCOPY  05/17/2021   poor prep- repeated on 06/09/21 Dr Ardis Hughs   ORIF ANKLE FRACTURE Right 11/12/2013   Procedure: OPEN REDUCTION INTERNAL FIXATION (ORIF) ANKLE FRACTURE/ irrigation and debridement of right forefoot wound  dehisence;  Surgeon: Wylene Simmer, MD;  Location: [facility];  Service: Orthopedics;  Laterality: Right;   TOE SURGERY Right 11/06/2013   [facility]     HALLUX           DR  HYATT   Social History   Occupational History   Occupation: Gaffer; Mining engineer    Employer: [employer]  Tobacco Use   Smoking status: Never   Smokeless tobacco: Never  Vaping Use   Vaping Use: Never used  Substance and Sexual Activity   Alcohol use: Yes    Comment: occ   Drug use: No   Sexual activity: Yes    Partners: Female

## 2022-04-27 ENCOUNTER — Encounter: Payer: Self-pay | Admitting: Family

## 2022-04-27 ENCOUNTER — Ambulatory Visit (INDEPENDENT_AMBULATORY_CARE_PROVIDER_SITE_OTHER): Payer: 59 | Admitting: Family

## 2022-04-27 VITALS — BP 112/74 | HR 66 | Temp 98.3°F | Resp 16 | Ht 74.0 in | Wt 259.0 lb

## 2022-04-27 DIAGNOSIS — W57XXXA Bitten or stung by nonvenomous insect and other nonvenomous arthropods, initial encounter: Secondary | ICD-10-CM | POA: Diagnosis not present

## 2022-04-27 DIAGNOSIS — S70362A Insect bite (nonvenomous), left thigh, initial encounter: Secondary | ICD-10-CM

## 2022-04-27 DIAGNOSIS — J069 Acute upper respiratory infection, unspecified: Secondary | ICD-10-CM | POA: Diagnosis not present

## 2022-04-27 MED ORDER — DOXYCYCLINE HYCLATE 100 MG PO TABS: 100.0000 mg | ORAL_TABLET | Freq: Two times a day (BID) | ORAL | 0 refills | Status: AC

## 2022-04-27 NOTE — Assessment & Plan Note (Signed)
Possible sinusitis, rx doxycycline as well as for tick bite prophylaxis Take antibiotic as prescribed. Increase oral fluids. Pt to f/u if sx worsen and or fail to improve in 2-3 days.

## 2022-04-27 NOTE — Assessment & Plan Note (Signed)
Prophylaxis  rx RX doxycycline 100 mg po bid x 10 days Pt declines lab testing, if pt still with symptoms and concern will test further and pt to f/u with pcp

## 2022-04-27 NOTE — Progress Notes (Signed)
Established Patient Office Visit  Subjective:  Patient ID: Cameron Mcneil, male    DOB: October 08, 1966  Age: 56 y.o. MRN: [id number]  CC:  Chief Complaint  Patient presents with   Tick Removal    Got bite by a tick and has had muscle ache, headache, and fatigue. X 1 week    HPI Cameron Mcneil is here today with concerns.   Bit by a tick five days ago, was slightly fattened but not engorged.  Had some blood in him when removed.  Was on his left thigh.  Still with some redness.  Did have some sinus pressure, slightly improved.   He is having muscle aches, headache, and fatigue for one week with some headache.  He thinks this may have started before the tick bite.    Past Medical History:  Diagnosis Date   Allergic rhinitis    Diabetes mellitus without complication ([place])    pre-diabetic =no meds per pt   ED (erectile dysfunction)    Hyperlipidemia    Hypertension    Hypogonadism male     Past Surgical History:  Procedure Laterality Date   COLONOSCOPY  05/17/2021   poor prep- repeated on 06/09/21 Dr Ardis Hughs   ORIF ANKLE FRACTURE Right 11/12/2013   Procedure: OPEN REDUCTION INTERNAL FIXATION (ORIF) ANKLE FRACTURE/ irrigation and debridement of right forefoot wound  dehisence;  Surgeon: Wylene Simmer, MD;  Location: [place];  Service: Orthopedics;  Laterality: Right;   TOE SURGERY Right 11/06/2013   MPJ     HALLUX           DR  HYATT    Family History  Problem Relation Age of Onset   Diabetes Other    Prostate cancer Neg Hx    Colon cancer Neg Hx    Colon polyps Neg Hx    Esophageal cancer Neg Hx    Rectal cancer Neg Hx    Stomach cancer Neg Hx     Social History   Socioeconomic History   Marital status: Single    Spouse name: Not on file   Number of children: Not on file   Years of education: 16   Highest education level: Not on file  Occupational History   Occupation: Gaffer; Mining engineer    Employer: [employer]  Tobacco Use   Smoking status: Never   Smokeless tobacco: Never  Vaping Use   Vaping Use: Never used  Substance and Sexual Activity   Alcohol use: Yes    Comment: occ   Drug use: No   Sexual activity: Yes    Partners: Female  Other Topics Concern   Not on file  Social History Narrative   [employer], [employer] Graduate. Single but in relationship. Work - Administrator, Therapist, sports.   2 kids   Social Determinants of Radio broadcast assistant Strain: Not on file  Food Insecurity: Not on file  Transportation Needs: Not on file  Physical Activity: Not on file  Stress: Not on file  Social Connections: Not on file  Intimate Partner Violence: Not on file    Outpatient Medications Prior to Visit  Medication Sig Dispense Refill   amLODipine (NORVASC) 5 MG tablet Take 1 tablet (5 mg total) by mouth daily. 90 tablet 1   Multiple Vitamin (MULTIVITAMIN) tablet Take 1 tablet by mouth daily.     No facility-administered medications prior to visit.    No Known Allergies      Objective:  Physical Exam Vitals reviewed.  Constitutional:      General: He is not in acute distress.    Appearance: Normal appearance. He is normal weight. He is not ill-appearing, toxic-appearing or diaphoretic.  Pulmonary:     Effort: Pulmonary effort is normal.  Neurological:     General: No focal deficit present.     Mental Status: He is alert and oriented to person, place, and time. Mental status is at baseline.  Psychiatric:        Mood and Affect: Mood normal.        Behavior: Behavior normal.        Thought Content: Thought content normal.        Judgment: Judgment normal.    BP 112/74   Pulse 66   Temp 98.3 F (36.8 C)   Resp 16   Ht '6\' 2"'$  (1.88 m)   Wt 259 lb (117.5 kg)   SpO2 97%   BMI 33.25 kg/m  Wt Readings from Last 3 Encounters:  04/27/22 259 lb (117.5 kg)  03/22/22 261 lb 1 oz (118.4 kg)  03/12/22 265 lb (120.2 kg)     Health Maintenance Due  Topic Date  Due   OPHTHALMOLOGY EXAM  Never done   Zoster Vaccines- Shingrix (1 of 2) Never done   COVID-19 Vaccine (3 - Booster for Pfizer series) 10/19/2020   URINE MICROALBUMIN  06/10/2021    There are no preventive care reminders to display for this patient.  Lab Results  Component Value Date   TSH 2.71 02/19/2013   Lab Results  Component Value Date   WBC 7.2 04/03/2021   HGB 16.2 04/03/2021   HCT 47.5 04/03/2021   MCV 81.6 04/03/2021   PLT 230.0 04/03/2021   Lab Results  Component Value Date   NA 137 03/22/2022   K 4.2 03/22/2022   CO2 28 03/22/2022   GLUCOSE 116 (H) 03/22/2022   BUN 13 03/22/2022   CREATININE 1.05 03/22/2022   BILITOT 0.6 04/03/2021   ALKPHOS 33 (L) 04/03/2021   AST 19 04/03/2021   ALT 25 04/03/2021   PROT 7.4 04/03/2021   ALBUMIN 4.7 04/03/2021   CALCIUM 9.5 03/22/2022   GFR 80.93 03/22/2022   Lab Results  Component Value Date   HGBA1C 6.7 (H) 03/22/2022      Assessment & Plan:   Problem List Items Addressed This Visit       Respiratory   Upper respiratory tract infection - Primary    Possible sinusitis, rx doxycycline as well as for tick bite prophylaxis Take antibiotic as prescribed. Increase oral fluids. Pt to f/u if sx worsen and or fail to improve in 2-3 days.        Relevant Medications   doxycycline (VIBRA-TABS) 100 MG tablet     Musculoskeletal and Integument   Tick bite    Prophylaxis  rx RX doxycycline 100 mg po bid x 10 days Pt declines lab testing, if pt still with symptoms and concern will test further and pt to f/u with pcp        Relevant Medications   doxycycline (VIBRA-TABS) 100 MG tablet    Meds ordered this encounter  Medications   doxycycline (VIBRA-TABS) 100 MG tablet    Sig: Take 1 tablet (100 mg total) by mouth 2 (two) times daily for 10 days.    Dispense:  20 tablet    Refill:  0    Order Specific Question:   Supervising Provider    Answer:  BEDSOLE, AMY E [[id number]]    Follow-up: No follow-ups on file.     Eugenia Pancoast, FNP

## 2022-05-02 ENCOUNTER — Encounter: Payer: Self-pay | Admitting: Physical Therapy

## 2022-05-02 ENCOUNTER — Ambulatory Visit (INDEPENDENT_AMBULATORY_CARE_PROVIDER_SITE_OTHER): Payer: 59 | Admitting: Physical Therapy

## 2022-05-02 DIAGNOSIS — M25511 Pain in right shoulder: Secondary | ICD-10-CM

## 2022-05-02 DIAGNOSIS — M6281 Muscle weakness (generalized): Secondary | ICD-10-CM | POA: Diagnosis not present

## 2022-05-02 NOTE — Therapy (Addendum)
OUTPATIENT PHYSICAL THERAPY SHOULDER EVALUATION   Patient Name: Cameron Mcneil MRN: [id number] DOB:02-Jan-1966, 56 y.o., male Today's Date: 05/02/2022   PT End of Session - 05/02/22 0851     Visit Number 1    Number of Visits 7    Date for PT Re-Evaluation 06/15/22    PT Start Time 0808    PT Stop Time 0846    PT Time Calculation (min) 38 min    Activity Tolerance Patient tolerated treatment well    Behavior During Therapy [facility] for tasks assessed/performed             Past Medical History:  Diagnosis Date   Allergic rhinitis    Diabetes mellitus without complication ([place])    pre-diabetic =no meds per pt   ED (erectile dysfunction)    Hyperlipidemia    Hypertension    Hypogonadism male    Past Surgical History:  Procedure Laterality Date   COLONOSCOPY  05/17/2021   poor prep- repeated on 06/09/21 Dr Ardis Hughs   ORIF ANKLE FRACTURE Right 11/12/2013   Procedure: OPEN REDUCTION INTERNAL FIXATION (ORIF) ANKLE FRACTURE/ irrigation and debridement of right forefoot wound  dehisence;  Surgeon: Wylene Simmer, MD;  Location: [place];  Service: Orthopedics;  Laterality: Right;   TOE SURGERY Right 11/06/2013   MPJ     HALLUX           DR  HYATT   Patient Active Problem List   Diagnosis Date Noted   Upper respiratory tract infection 04/27/2022   Tick bite 04/27/2022   Nontraumatic incomplete tear of right rotator cuff 04/18/2022   Prostate enlargement 03/22/2022   Urinary retention 03/22/2022   Persistent proteinuria 03/22/2022   Diabetes mellitus without complication ([place]) 01/65/5374   Benign essential hypertension 01/22/2017   HLD (hyperlipidemia) 08/24/2015   Advance care planning 08/24/2015   Obesity 02/21/2013   Hypogonadism in male 01/10/2009   ERECTILE DYSFUNCTION 12/30/2008    PCP: Elsie Stain MD  REFERRING PROVIDER: Joni Fears MD  REFERRING DIAG: M25.511 (ICD-10-CM) - Acute pain of right shoulder  THERAPY DIAG:  Acute pain of right shoulder - Plan: PT  plan of care cert/re-cert  Muscle weakness (generalized) - Plan: PT plan of care cert/re-cert  Rationale for Evaluation and Treatment Rehabilitation  ONSET DATE: about 2 months ago when working out  SUBJECTIVE:                                                                                                                                                                                      SUBJECTIVE STATEMENT: Pt arriving with Rt shoulder pain which began when he was lifting 100# while working out.  Pt stating he heard a "popping" sound. Pt stated it seemed to get better but while playing his daughter he re-injured it again. Pt wishing to try conservative measures before considering surgery.   PERTINENT HISTORY: DM, HTN, hyperlipidemia, ORIF, ankle fx, toe surgery   PAIN:  Are you having pain? Yes: NPRS scale: 1-2/10 Pain location: Rt anterior shoulder Pain description: sore, achy Aggravating factors: lifting, reaching behind my back to put on belt Relieving factors: resting  PRECAUTIONS: None  WEIGHT BEARING RESTRICTIONS No  FALLS:  Has patient fallen in last 6 months? 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   PATIENT SURVEYS:  05/02/2022: FOTO 59%, (predicted 74%)  COGNITION:  05/02/22: Overall cognitive status: Within functional limits for tasks  assessed     SENSATION: 05/02/22: WFL  POSTURE: 05/02/22: Mild forward shoulders and head  UPPER EXTREMITY ROM:   Active ROM Right eval Left eval  Shoulder flexion 150 160  Shoulder extension 48 50  Shoulder abduction 168 170  Shoulder adduction    Shoulder internal rotation 50 72  Shoulder external rotation 66 82  Elbow flexion    Elbow extension    Wrist flexion    Wrist extension    Wrist ulnar deviation    Wrist radial deviation    Wrist pronation    Wrist supination    (Blank rows = not tested)  UPPER EXTREMITY MMT:  MMT Right eval Left eval  Shoulder flexion 5//5 5/5  Shoulder extension 5/5 5/5  Shoulder abduction    Shoulder adduction    Shoulder internal rotation 4+/5 4+/5  Shoulder external rotation 4+/5 4+/5  Middle trapezius    Lower trapezius    Elbow flexion    Elbow extension    Wrist flexion    Wrist extension    Wrist ulnar deviation    Wrist radial deviation    Wrist pronation    Wrist supination    Grip strength (lbs) 132.2 133.2  (Blank rows = not tested) Rt sided dominate   SHOULDER SPECIAL TESTS: 05/02/2022:   Negative: Empty Can on Rt   Negative Neers on Rt    PALPATION:  Mild tenderness over supraspinatus tendon   TODAY'S TREATMENT:  HEP instruction/performance c cues for techniques, handout provided.  Trial set performed of each for comprehension and symptom assessment.  See below for exercise list.   PATIENT EDUCATION: Education details: PT POC, HEP Person educated: Patient Education method: Explanation, Demonstration, Tactile cues, Verbal cues, and Handouts Education comprehension: verbalized understanding and returned demonstration   HOME EXERCISE PROGRAM: Access Code: [email or web address] URL: [email or web address] Date: 05/02/2022 Prepared by: Kearney Hard  Exercises - Sleeper Stretch  - 2 x daily - 7 x weekly - 3-5 reps - 10 seconds hold - Single Arm Serratus Punches in Supine with Dumbbell  - 2 x daily -  7 x weekly - 3 reps - Shoulder External Rotation and Scapular Retraction with Resistance  - 2 x daily - 7 x weekly -  2 sets - 10 reps - Standing Shoulder Row with Anchored Resistance  - 2 x daily - 7 x weekly - 3 sets - 10 reps - 3 seconds hold - Doorway Pec Stretch at 90 Degrees Abduction  - 2 x daily - 7 x weekly - 3-5 reps - 20 seconds hold  ASSESSMENT:  CLINICAL IMPRESSION: Patient is a 56 y.o.male  who comes to clinic with complaints of Rt shoulder pain s/p rotator cuff tear with mobility, strength and movement coordination deficits that impair their ability to perform usual daily and recreational functional activities without increase difficulty/symptoms.  Patient to benefit from skilled PT services to address impairments and limitations to improve to previous level of function without restriction secondary to condition.    OBJECTIVE IMPAIRMENTS decreased activity tolerance, impaired flexibility, impaired UE functional use, and pain.   ACTIVITY LIMITATIONS carrying, lifting, sleeping, dressing, and reach over head  PARTICIPATION LIMITATIONS: community activity and yard work  PERSONAL FACTORS  DM, HTN, hyperlipidemia, ORIF, ankle fx, toe surgery  are also affecting patient's functional outcome.   REHAB POTENTIAL: Good  CLINICAL DECISION MAKING: Stable/uncomplicated  EVALUATION COMPLEXITY: Low   GOALS: Goals reviewed with patient? Yes  SHORT TERM GOALS: Target date: 05/23/2022  (Remove Blue Hyperlink)  Patient will demonstrate independent use of initial home exercise program to maintain progress from in clinic treatments. Goal status: New   Long term PT goals:  Target Date:  06/15/2022: Patient will demonstrate/report pain at worst less than or equal to 2/10 to facilitate minimal limitation in daily activity secondary to pain symptoms. Goal status: New   Patient will demonstrate independent use of home exercise program to facilitate ability to maintain/progress functional  gains from skilled physical therapy services. Goal status: New   Patient will demonstrate FOTO outcome > or = 74% to indicate reduced disability due to condition. Goal status: New   Pt will be able to improve his Rt shoulder ER to >/= 75 degrees to improve functional mobility.  Goal status: New       5.  Pt will be able to donne his pants with belt with no shoulder pain.   Goal status: New  PLAN: PT FREQUENCY: 1x/week  PT DURATION: 6 weeks  PLANNED INTERVENTIONS: Therapeutic exercises, Therapeutic activity, Neuromuscular re-education, Balance training, Gait training, Patient/Family education, Joint mobilization, Dry Needling, Electrical stimulation, Cryotherapy, Moist heat, Taping, Ultrasound, and Manual therapy  PLAN FOR NEXT SESSION: gentle shoulder strengthening, IR/ER stretching,    Oretha Caprice, PT, MPT 05/02/2022, 12:20 PM

## 2022-05-09 ENCOUNTER — Encounter: Payer: 59 | Admitting: Physical Therapy

## 2022-05-09 ENCOUNTER — Telehealth: Payer: Self-pay | Admitting: Physical Therapy

## 2022-05-09 NOTE — Therapy (Incomplete)
OUTPATIENT PHYSICAL THERAPY TREATMENT NOTE   Patient Name: Cameron Mcneil MRN: [id number] DOB:08-19-1966, 56 y.o., male Today's Date: 05/09/2022  PCP: Elsie Stain MD REFERRING PROVIDER: Joni Fears MD  END OF SESSION:    Past Medical History:  Diagnosis Date   Allergic rhinitis    Diabetes mellitus without complication ([place])    pre-diabetic =no meds per pt   ED (erectile dysfunction)    Hyperlipidemia    Hypertension    Hypogonadism male    Past Surgical History:  Procedure Laterality Date   COLONOSCOPY  05/17/2021   poor prep- repeated on 06/09/21 Dr Ardis Hughs   ORIF ANKLE FRACTURE Right 11/12/2013   Procedure: OPEN REDUCTION INTERNAL FIXATION (ORIF) ANKLE FRACTURE/ irrigation and debridement of right forefoot wound  dehisence;  Surgeon: Wylene Simmer, MD;  Location: [place];  Service: Orthopedics;  Laterality: Right;   TOE SURGERY Right 11/06/2013   MPJ     HALLUX           DR  HYATT   Patient Active Problem List   Diagnosis Date Noted   Upper respiratory tract infection 04/27/2022   Tick bite 04/27/2022   Nontraumatic incomplete tear of right rotator cuff 04/18/2022   Prostate enlargement 03/22/2022   Urinary retention 03/22/2022   Persistent proteinuria 03/22/2022   Diabetes mellitus without complication ([place]) 40/97/3532   Benign essential hypertension 01/22/2017   HLD (hyperlipidemia) 08/24/2015   Advance care planning 08/24/2015   Obesity 02/21/2013   Hypogonadism in male 01/10/2009   ERECTILE DYSFUNCTION 12/30/2008    REFERRING DIAG: M25.511 (ICD-10-CM) - Acute pain of right shoulder  THERAPY DIAG:  No diagnosis found.  Rationale for Evaluation and Treatment Rehabilitation  PERTINENT HISTORY: DM, HTN, hyperlipidemia, ORIF, ankle fx, toe surgery   PRECAUTIONS: None  SUBJECTIVE: ***  PAIN:  Are you having pain? {[id number]}   OBJECTIVE: (objective measures completed at initial evaluation unless otherwise dated)   PATIENT SURVEYS:   05/02/2022: FOTO 59%, (predicted 74%)  POSTURE: 05/02/22: Mild forward shoulders and head   UPPER EXTREMITY ROM:    Active ROM Right eval Left eval  Shoulder flexion 150 160  Shoulder extension 48 50  Shoulder abduction 168 170  Shoulder adduction      Shoulder internal rotation 50 72  Shoulder external rotation 66 82  Elbow flexion      Elbow extension      Wrist flexion      Wrist extension      Wrist ulnar deviation      Wrist radial deviation      Wrist pronation      Wrist supination      (Blank rows = not tested)   UPPER EXTREMITY MMT:   MMT Right eval Left eval  Shoulder flexion 5//5 5/5  Shoulder extension 5/5 5/5  Shoulder abduction      Shoulder adduction      Shoulder internal rotation 4+/5 4+/5  Shoulder external rotation 4+/5 4+/5  Grip strength (lbs) 132.2 133.2  (Blank rows = not tested) Rt sided dominate     SHOULDER SPECIAL TESTS: 05/02/2022:             Negative: Empty Can on Rt             Negative Neers on Rt       PALPATION:  Mild tenderness over supraspinatus tendon             TODAY'S TREATMENT:  05/09/22 ***   05/02/22 HEP instruction/performance c cues for  techniques, handout provided.  Trial set performed of each for comprehension and symptom assessment.  See below for exercise list.     PATIENT EDUCATION: Education details: PT POC, HEP Person educated: Patient Education method: Explanation, Demonstration, Tactile cues, Verbal cues, and Handouts Education comprehension: verbalized understanding and returned demonstration     HOME EXERCISE PROGRAM: Access Code: [email or web address] URL: [email or web address] Date: 05/02/2022 Prepared by: Kearney Hard   Exercises - Sleeper Stretch  - 2 x daily - 7 x weekly - 3-5 reps - 10 seconds hold - Single Arm Serratus Punches in Supine with Dumbbell  - 2 x daily - 7 x weekly - 3 reps - Shoulder External Rotation and Scapular Retraction with Resistance  - 2 x daily - 7 x weekly - 2  sets - 10 reps - Standing Shoulder Row with Anchored Resistance  - 2 x daily - 7 x weekly - 3 sets - 10 reps - 3 seconds hold - Doorway Pec Stretch at 90 Degrees Abduction  - 2 x daily - 7 x weekly - 3-5 reps - 20 seconds hold   ASSESSMENT:   CLINICAL IMPRESSION: *** Patient is a 56 y.o.male  who comes to clinic with complaints of Rt shoulder pain s/p rotator cuff tear with mobility, strength and movement coordination deficits that impair their ability to perform usual daily and recreational functional activities without increase difficulty/symptoms.  Patient to benefit from skilled PT services to address impairments and limitations to improve to previous level of function without restriction secondary to condition.      OBJECTIVE IMPAIRMENTS decreased activity tolerance, impaired flexibility, impaired UE functional use, and pain.    ACTIVITY LIMITATIONS carrying, lifting, sleeping, dressing, and reach over head   PARTICIPATION LIMITATIONS: community activity and yard work   PERSONAL FACTORS  DM, HTN, hyperlipidemia, ORIF, ankle fx, toe surgery  are also affecting patient's functional outcome.    REHAB POTENTIAL: Good   CLINICAL DECISION MAKING: Stable/uncomplicated   EVALUATION COMPLEXITY: Low     GOALS: Goals reviewed with patient? Yes   SHORT TERM GOALS: Target date: 05/23/2022  (Remove Blue Hyperlink)   Patient will demonstrate independent use of initial home exercise program to maintain progress from in clinic treatments. Goal status: New   Long term PT goals:  Target Date:  06/15/2022: Patient will demonstrate/report pain at worst less than or equal to 2/10 to facilitate minimal limitation in daily activity secondary to pain symptoms. Goal status: New   Patient will demonstrate independent use of home exercise program to facilitate ability to maintain/progress functional gains from skilled physical therapy services. Goal status: New   Patient will demonstrate FOTO outcome  > or = 74% to indicate reduced disability due to condition. Goal status: New   Pt will be able to improve his Rt shoulder ER to >/= 75 degrees to improve functional mobility.  Goal status: New       5.  Pt will be able to donne his pants with belt with no shoulder pain.   Goal status: New   PLAN: PT FREQUENCY: 1x/week   PT DURATION: 6 weeks   PLANNED INTERVENTIONS: Therapeutic exercises, Therapeutic activity, Neuromuscular re-education, Balance training, Gait training, Patient/Family education, Joint mobilization, Dry Needling, Electrical stimulation, Cryotherapy, Moist heat, Taping, Ultrasound, and Manual therapy   PLAN FOR NEXT SESSION: *** gentle shoulder strengthening, IR/ER stretching,     Faustino Congress, PT 05/09/2022, 7:21 AM

## 2022-05-09 NOTE — Telephone Encounter (Signed)
Spoke with pt about NS for PT this morning.  He thought his appt was tomorrow.  Offered later appt time today and pt declined.  Reminded of next scheduled appt.  Laureen Abrahams, PT, DPT 05/09/22 8:22 AM

## 2022-05-16 ENCOUNTER — Telehealth: Payer: Self-pay | Admitting: Physical Therapy

## 2022-05-16 ENCOUNTER — Encounter: Payer: Self-pay | Admitting: Physical Therapy

## 2022-05-16 ENCOUNTER — Ambulatory Visit (INDEPENDENT_AMBULATORY_CARE_PROVIDER_SITE_OTHER): Payer: 59 | Admitting: Physical Therapy

## 2022-05-16 DIAGNOSIS — M25511 Pain in right shoulder: Secondary | ICD-10-CM

## 2022-05-16 DIAGNOSIS — M6281 Muscle weakness (generalized): Secondary | ICD-10-CM | POA: Diagnosis not present

## 2022-05-16 NOTE — Telephone Encounter (Signed)
I called pt after he missed his 8:00 appointment. Pt stating he "forgot" about it. Pt requested a later appointment this morning. We were able to schedule him at 9:30 am with Faustino Congress, PT , DPT.   Kearney Hard, PT, MPT 05/16/22 8:44 AM

## 2022-05-16 NOTE — Therapy (Signed)
OUTPATIENT PHYSICAL THERAPY TREATMENT NOTE   Patient Name: Cameron Mcneil MRN: [id number] DOB:03-Sep-1966, 56 y.o., male Today's Date: 05/16/2022  PCP: Elsie Stain MD REFERRING PROVIDER: Joni Fears MD  END OF SESSION:   PT End of Session - 05/16/22 0935     Visit Number 2    Number of Visits 7    Date for PT Re-Evaluation 06/15/22    PT Start Time 0932    PT Stop Time 1010    PT Time Calculation (min) 38 min    Activity Tolerance Patient tolerated treatment well    Behavior During Therapy [facility] for tasks assessed/performed              Past Medical History:  Diagnosis Date   Allergic rhinitis    Diabetes mellitus without complication ([place])    pre-diabetic =no meds per pt   ED (erectile dysfunction)    Hyperlipidemia    Hypertension    Hypogonadism male    Past Surgical History:  Procedure Laterality Date   COLONOSCOPY  05/17/2021   poor prep- repeated on 06/09/21 Dr Ardis Hughs   ORIF ANKLE FRACTURE Right 11/12/2013   Procedure: OPEN REDUCTION INTERNAL FIXATION (ORIF) ANKLE FRACTURE/ irrigation and debridement of right forefoot wound  dehisence;  Surgeon: Wylene Simmer, MD;  Location: [place];  Service: Orthopedics;  Laterality: Right;   TOE SURGERY Right 11/06/2013   MPJ     HALLUX           DR  HYATT   Patient Active Problem List   Diagnosis Date Noted   Upper respiratory tract infection 04/27/2022   Tick bite 04/27/2022   Nontraumatic incomplete tear of right rotator cuff 04/18/2022   Prostate enlargement 03/22/2022   Urinary retention 03/22/2022   Persistent proteinuria 03/22/2022   Diabetes mellitus without complication ([place]) 76/28/3151   Benign essential hypertension 01/22/2017   HLD (hyperlipidemia) 08/24/2015   Advance care planning 08/24/2015   Obesity 02/21/2013   Hypogonadism in male 01/10/2009   ERECTILE DYSFUNCTION 12/30/2008    REFERRING DIAG: M25.511 (ICD-10-CM) - Acute pain of right shoulder  THERAPY DIAG:  Acute pain of right  shoulder  Muscle weakness (generalized)   Rationale for Evaluation and Treatment Rehabilitation  PERTINENT HISTORY: DM, HTN, hyperlipidemia, ORIF, ankle fx, toe surgery   PRECAUTIONS: none  SUBJECTIVE: feels pain is improving; does have some episodes of sharp pain; had done a "few" exercises  PAIN:  Are you having pain? No: NPRS scale: 0/10   OBJECTIVE: (objective measures completed at initial evaluation unless otherwise dated)   PATIENT SURVEYS:  05/02/2022: FOTO 59%, (predicted 74%)   POSTURE: 05/02/22: Mild forward shoulders and head   UPPER EXTREMITY ROM:    Active ROM Right eval Left eval Right 05/16/22  Shoulder flexion 150 160 155  Shoulder extension 48 50   Shoulder abduction 168 170   Shoulder adduction       Shoulder internal rotation 50 72 85  Shoulder external rotation 66 82 82  (Blank rows = not tested)   UPPER EXTREMITY MMT:   MMT Right eval Left eval  Shoulder flexion 5//5 5/5  Shoulder extension 5/5 5/5  Shoulder abduction      Shoulder adduction      Shoulder internal rotation 4+/5 4+/5  Shoulder external rotation 4+/5 4+/5  Grip strength (lbs) 132.2 133.2  (Blank rows = not tested) Rt sided dominate     SHOULDER SPECIAL TESTS: 05/02/2022:  Negative: Empty Can on Rt             Negative Neers on Rt             TODAY'S TREATMENT:  05/16/22 Therex:      Aerobic: UBE L5 x 8 min (3' fwd, 3' bwd, then 1 min fwd/back)     Supine: Rt shoulder IR sleeper stretch 5x15 sec Serratus punches 10# on Rt 3x10     Sitting: Bil ER with L4 band 2x10 (mild discomfort) Cable rows 35# x 10; then 55# 2x10 Lat pull downs 65# 3x10     Standing: Rows with L4 band 3x10 Overhead press (forward flexion hold) 5# bil 3x10 Abduction bil (5# Lt; 3# Rt) 3x10   05/02/22 HEP instruction/performance c cues for techniques, handout provided.  Trial set performed of each for comprehension and symptom assessment.  See below for exercise list.     PATIENT  EDUCATION: Education details: PT POC, HEP Person educated: Patient Education method: Explanation, Demonstration, Tactile cues, Verbal cues, and Handouts Education comprehension: verbalized understanding and returned demonstration     HOME EXERCISE PROGRAM: Access Code: [email or web address] URL: [email or web address] Date: 05/02/2022 Prepared by: Kearney Hard   Exercises - Sleeper Stretch  - 2 x daily - 7 x weekly - 3-5 reps - 10 seconds hold - Single Arm Serratus Punches in Supine with Dumbbell  - 2 x daily - 7 x weekly - 3 reps - Shoulder External Rotation and Scapular Retraction with Resistance  - 2 x daily - 7 x weekly - 2 sets - 10 reps - Standing Shoulder Row with Anchored Resistance  - 2 x daily - 7 x weekly - 3 sets - 10 reps - 3 seconds hold - Doorway Pec Stretch at 90 Degrees Abduction  - 2 x daily - 7 x weekly - 3-5 reps - 20 seconds hold   ASSESSMENT:   CLINICAL IMPRESSION: Overall pt has demonstrated improvement in ROM and pain meeting LTG #4.  Did initiate gym program today stressing importance of starting light and increasing weight as able.  Will continue to benefit from PT to maximize function.     OBJECTIVE IMPAIRMENTS decreased activity tolerance, impaired flexibility, impaired UE functional use, and pain.    ACTIVITY LIMITATIONS carrying, lifting, sleeping, dressing, and reach over head   PARTICIPATION LIMITATIONS: community activity and yard work   PERSONAL FACTORS  DM, HTN, hyperlipidemia, ORIF, ankle fx, toe surgery  are also affecting patient's functional outcome.    REHAB POTENTIAL: Good   CLINICAL DECISION MAKING: Stable/uncomplicated   EVALUATION COMPLEXITY: Low     GOALS: Goals reviewed with patient? Yes   SHORT TERM GOALS: Target date: 05/23/2022  (Remove Blue Hyperlink)   Patient will demonstrate independent use of initial home exercise program to maintain progress from in clinic treatments. Goal status: Ongoing 05/16/22   Long term PT  goals:  Target Date:  06/15/2022: Patient will demonstrate/report pain at worst less than or equal to 2/10 to facilitate minimal limitation in daily activity secondary to pain symptoms. Goal status: New   Patient will demonstrate independent use of home exercise program to facilitate ability to maintain/progress functional gains from skilled physical therapy services. Goal status: New   Patient will demonstrate FOTO outcome > or = 74% to indicate reduced disability due to condition. Goal status: New   Pt will be able to improve his Rt shoulder ER to >/= 75 degrees to improve functional mobility.  Goal status: MET 05/16/22  5.  Pt will be able to donne his pants with belt with no shoulder pain.   Goal status: New   PLAN: PT FREQUENCY: 1x/week   PT DURATION: 6 weeks   PLANNED INTERVENTIONS: Therapeutic exercises, Therapeutic activity, Neuromuscular re-education, Balance training, Gait training, Patient/Family education, Joint mobilization, Dry Needling, Electrical stimulation, Cryotherapy, Moist heat, Taping, Ultrasound, and Manual therapy   PLAN FOR NEXT SESSION: continue strengthening       Laureen Abrahams, PT, DPT 05/16/22 10:14 AM

## 2022-05-16 NOTE — Therapy (Deleted)
OUTPATIENT PHYSICAL THERAPY TREATMENT NOTE   Patient Name: Cameron Mcneil MRN: [id number] DOB:02/15/1966, 56 y.o., male Today's Date: 05/16/2022  PCP: Elsie Stain MD REFERRING PROVIDER: Joni Fears MD  END OF SESSION:    Past Medical History:  Diagnosis Date   Allergic rhinitis    Diabetes mellitus without complication ([place])    pre-diabetic =no meds per pt   ED (erectile dysfunction)    Hyperlipidemia    Hypertension    Hypogonadism male    Past Surgical History:  Procedure Laterality Date   COLONOSCOPY  05/17/2021   poor prep- repeated on 06/09/21 Dr Ardis Hughs   ORIF ANKLE FRACTURE Right 11/12/2013   Procedure: OPEN REDUCTION INTERNAL FIXATION (ORIF) ANKLE FRACTURE/ irrigation and debridement of right forefoot wound  dehisence;  Surgeon: Wylene Simmer, MD;  Location: [place];  Service: Orthopedics;  Laterality: Right;   TOE SURGERY Right 11/06/2013   MPJ     HALLUX           DR  HYATT   Patient Active Problem List   Diagnosis Date Noted   Upper respiratory tract infection 04/27/2022   Tick bite 04/27/2022   Nontraumatic incomplete tear of right rotator cuff 04/18/2022   Prostate enlargement 03/22/2022   Urinary retention 03/22/2022   Persistent proteinuria 03/22/2022   Diabetes mellitus without complication ([place]) 67/89/3810   Benign essential hypertension 01/22/2017   HLD (hyperlipidemia) 08/24/2015   Advance care planning 08/24/2015   Obesity 02/21/2013   Hypogonadism in male 01/10/2009   ERECTILE DYSFUNCTION 12/30/2008    REFERRING DIAG: M25.511 (ICD-10-CM) - Acute pain of right shoulder  THERAPY DIAG:  No diagnosis found.  Rationale for Evaluation and Treatment Rehabilitation  PERTINENT HISTORY: DM, HTN, hyperlipidemia, ORIF, ankle fx, toe surgery   PRECAUTIONS: none  SUBJECTIVE: ***  PAIN:  Are you having pain? Yes: NPRS scale: ***/10 Pain location: *** Pain description: *** Aggravating factors: *** Relieving factors: ***   OBJECTIVE:  (objective measures completed at initial evaluation unless otherwise dated)   DIAGNOSTIC FINDINGS:   MRI demonstrated a high-grade bursal sided tear of the supraspinatus with retraction of the bursal sided fibers measuring approximately 2.4 cm there is contrast from the arthrogram through the infraspinatus tendon which almost certainly represented high-grade articular sided tearing with full-thickness perforation teres minor and subscapularis tendons are intact.    PATIENT SURVEYS:  05/02/2022: FOTO 59%, (predicted 74%)   COGNITION:           05/02/22: Overall cognitive status: Within functional limits for tasks assessed                                  SENSATION: 05/02/22: WFL   POSTURE: 05/02/22: Mild forward shoulders and head   UPPER EXTREMITY ROM:    Active ROM Right eval Left eval  Shoulder flexion 150 160  Shoulder extension 48 50  Shoulder abduction 168 170  Shoulder adduction      Shoulder internal rotation 50 72  Shoulder external rotation 66 82  Elbow flexion      Elbow extension      Wrist flexion      Wrist extension      Wrist ulnar deviation      Wrist radial deviation      Wrist pronation      Wrist supination      (Blank rows = not tested)   UPPER EXTREMITY MMT:   MMT Right eval Left  eval  Shoulder flexion 5//5 5/5  Shoulder extension 5/5 5/5  Shoulder abduction      Shoulder adduction      Shoulder internal rotation 4+/5 4+/5  Shoulder external rotation 4+/5 4+/5  Middle trapezius      Lower trapezius      Elbow flexion      Elbow extension      Wrist flexion      Wrist extension      Wrist ulnar deviation      Wrist radial deviation      Wrist pronation      Wrist supination      Grip strength (lbs) 132.2 133.2  (Blank rows = not tested) Rt sided dominate     SHOULDER SPECIAL TESTS: 05/02/2022:             Negative: Empty Can on Rt             Negative Neers on Rt       PALPATION:  Mild tenderness over supraspinatus tendon              TODAY'S TREATMENT:  HEP instruction/performance c cues for techniques, handout provided.  Trial set performed of each for comprehension and symptom assessment.  See below for exercise list.     PATIENT EDUCATION: Education details: PT POC, HEP Person educated: Patient Education method: Explanation, Demonstration, Tactile cues, Verbal cues, and Handouts Education comprehension: verbalized understanding and returned demonstration     HOME EXERCISE PROGRAM: Access Code: [email or web address] URL: [email or web address] Date: 05/02/2022 Prepared by: Kearney Hard   Exercises - Sleeper Stretch  - 2 x daily - 7 x weekly - 3-5 reps - 10 seconds hold - Single Arm Serratus Punches in Supine with Dumbbell  - 2 x daily - 7 x weekly - 3 reps - Shoulder External Rotation and Scapular Retraction with Resistance  - 2 x daily - 7 x weekly - 2 sets - 10 reps - Standing Shoulder Row with Anchored Resistance  - 2 x daily - 7 x weekly - 3 sets - 10 reps - 3 seconds hold - Doorway Pec Stretch at 90 Degrees Abduction  - 2 x daily - 7 x weekly - 3-5 reps - 20 seconds hold   ASSESSMENT:   CLINICAL IMPRESSION: Patient is a 56 y.o.male  who comes to clinic with complaints of Rt shoulder pain s/p rotator cuff tear with mobility, strength and movement coordination deficits that impair their ability to perform usual daily and recreational functional activities without increase difficulty/symptoms.  Patient to benefit from skilled PT services to address impairments and limitations to improve to previous level of function without restriction secondary to condition.      OBJECTIVE IMPAIRMENTS decreased activity tolerance, impaired flexibility, impaired UE functional use, and pain.    ACTIVITY LIMITATIONS carrying, lifting, sleeping, dressing, and reach over head   PARTICIPATION LIMITATIONS: community activity and yard work   PERSONAL FACTORS  DM, HTN, hyperlipidemia, ORIF, ankle fx, toe surgery  are also  affecting patient's functional outcome.    REHAB POTENTIAL: Good   CLINICAL DECISION MAKING: Stable/uncomplicated   EVALUATION COMPLEXITY: Low     GOALS: Goals reviewed with patient? Yes   SHORT TERM GOALS: Target date: 05/23/2022  (Remove Blue Hyperlink)   Patient will demonstrate independent use of initial home exercise program to maintain progress from in clinic treatments. Goal status: New   Long term PT goals:  Target Date:  06/15/2022: Patient will demonstrate/report pain at worst less than  or equal to 2/10 to facilitate minimal limitation in daily activity secondary to pain symptoms. Goal status: New   Patient will demonstrate independent use of home exercise program to facilitate ability to maintain/progress functional gains from skilled physical therapy services. Goal status: New   Patient will demonstrate FOTO outcome > or = 74% to indicate reduced disability due to condition. Goal status: New   Pt will be able to improve his Rt shoulder ER to >/= 75 degrees to improve functional mobility.  Goal status: New       5.  Pt will be able to donne his pants with belt with no shoulder pain.   Goal status: New   PLAN: PT FREQUENCY: 1x/week   PT DURATION: 6 weeks   PLANNED INTERVENTIONS: Therapeutic exercises, Therapeutic activity, Neuromuscular re-education, Balance training, Gait training, Patient/Family education, Joint mobilization, Dry Needling, Electrical stimulation, Cryotherapy, Moist heat, Taping, Ultrasound, and Manual therapy   PLAN FOR NEXT SESSION: gentle shoulder strengthening, IR/ER stretching,       Oretha Caprice, PT, MPT 05/16/2022, 7:57 AM

## 2022-05-23 ENCOUNTER — Encounter: Payer: Self-pay | Admitting: Physical Therapy

## 2022-05-23 ENCOUNTER — Ambulatory Visit (INDEPENDENT_AMBULATORY_CARE_PROVIDER_SITE_OTHER): Payer: 59 | Admitting: Physical Therapy

## 2022-05-23 DIAGNOSIS — M6281 Muscle weakness (generalized): Secondary | ICD-10-CM

## 2022-05-23 DIAGNOSIS — M25511 Pain in right shoulder: Secondary | ICD-10-CM | POA: Diagnosis not present

## 2022-05-23 NOTE — Therapy (Addendum)
OUTPATIENT PHYSICAL THERAPY TREATMENT NOTE Discharge   Patient Name: Cameron Mcneil MRN: [id number] DOB:November 20, 1966, 56 y.o., male Today's Date: 05/16/2022  PCP: Elsie Stain MD REFERRING PROVIDER: Joni Fears MD  END OF SESSION:   PT End of Session - 05/23/22 0824     Visit Number 3    Number of Visits 7    Date for PT Re-Evaluation 06/15/22    PT Start Time 0820    PT Stop Time 0903    PT Time Calculation (min) 43 min    Activity Tolerance Patient tolerated treatment well    Behavior During Therapy [facility] for tasks assessed/performed               Past Medical History:  Diagnosis Date   Allergic rhinitis    Diabetes mellitus without complication ([place])    pre-diabetic =no meds per pt   ED (erectile dysfunction)    Hyperlipidemia    Hypertension    Hypogonadism male    Past Surgical History:  Procedure Laterality Date   COLONOSCOPY  05/17/2021   poor prep- repeated on 06/09/21 Dr Ardis Hughs   ORIF ANKLE FRACTURE Right 11/12/2013   Procedure: OPEN REDUCTION INTERNAL FIXATION (ORIF) ANKLE FRACTURE/ irrigation and debridement of right forefoot wound  dehisence;  Surgeon: Wylene Simmer, MD;  Location: [place];  Service: Orthopedics;  Laterality: Right;   TOE SURGERY Right 11/06/2013   MPJ     HALLUX           DR  HYATT   Patient Active Problem List   Diagnosis Date Noted   Upper respiratory tract infection 04/27/2022   Tick bite 04/27/2022   Nontraumatic incomplete tear of right rotator cuff 04/18/2022   Prostate enlargement 03/22/2022   Urinary retention 03/22/2022   Persistent proteinuria 03/22/2022   Diabetes mellitus without complication ([place]) 64/33/2951   Benign essential hypertension 01/22/2017   HLD (hyperlipidemia) 08/24/2015   Advance care planning 08/24/2015   Obesity 02/21/2013   Hypogonadism in male 01/10/2009   ERECTILE DYSFUNCTION 12/30/2008    REFERRING DIAG: M25.511 (ICD-10-CM) - Acute pain of right shoulder  THERAPY DIAG:  Acute pain of  right shoulder  Muscle weakness (generalized)   Rationale for Evaluation and Treatment Rehabilitation  PERTINENT HISTORY: DM, HTN, hyperlipidemia, ORIF, ankle fx, toe surgery   PRECAUTIONS: none  SUBJECTIVE: only having pain with lying on Rt side   PAIN:  Are you having pain? No: NPRS scale: 0/10   OBJECTIVE: (objective measures completed at initial evaluation unless otherwise dated)   PATIENT SURVEYS:  05/02/2022: FOTO 59%, (predicted 74%)   POSTURE: 05/02/22: Mild forward shoulders and head   UPPER EXTREMITY ROM:    Active ROM Right eval Left eval Right 05/16/22  Shoulder flexion 150 160 155  Shoulder extension 48 50   Shoulder abduction 168 170   Shoulder adduction       Shoulder internal rotation 50 72 85  Shoulder external rotation 66 82 82  (Blank rows = not tested)   UPPER EXTREMITY MMT:   MMT Right eval Left eval  Shoulder flexion 5//5 5/5  Shoulder extension 5/5 5/5  Shoulder abduction      Shoulder adduction      Shoulder internal rotation 4+/5 4+/5  Shoulder external rotation 4+/5 4+/5  Grip strength (lbs) 132.2 133.2  (Blank rows = not tested) Rt sided dominate     SHOULDER SPECIAL TESTS: 05/02/2022:             Negative: Empty Can on Rt  Negative Neers on Rt             TODAY'S TREATMENT:  05/23/22 Therex:      Aerobic: UBE L5 x 8 min (alt 2' fwd/bwd)     Sidelying: Rt shoulder IR sleeper stretch 3x20 sec ER ball toss (2#) 3x20 sec     Sitting: Cable rows 55# 3x10 Lat pull downs 65# 3x10     Standing: ER 5# cable 3x10 - mod cues for technique IR 10# on cable 3x10 Rows 55# on cable 3x10 Horizontal abduction/adduction 3x10 (5# on Lt, 3# on Rt) IR stretch with strap 3x20 sec Overhead press 3x10 (5# on Lt, 3# on Rt) L2 band half circles x 20 reps each direction  05/16/22 Therex:      Aerobic: UBE L5 x 8 min (3' fwd, 3' bwd, then 1 min fwd/back)     Supine: Rt shoulder IR sleeper stretch 5x15 sec Serratus punches 10# on  Rt 3x10     Sitting: Bil ER with L4 band 2x10 (mild discomfort) Cable rows 35# x 10; then 55# 2x10 Lat pull downs 65# 3x10     Standing: Rows with L4 band 3x10 Overhead press (forward flexion hold) 5# bil 3x10 Abduction bil (5# Lt; 3# Rt) 3x10   05/02/22 HEP instruction/performance c cues for techniques, handout provided.  Trial set performed of each for comprehension and symptom assessment.  See below for exercise list.     PATIENT EDUCATION: Education details: PT POC, HEP Person educated: Patient Education method: Explanation, Demonstration, Tactile cues, Verbal cues, and Handouts Education comprehension: verbalized understanding and returned demonstration     HOME EXERCISE PROGRAM: Access Code: [email or web address] URL: [email or web address] Date: 05/02/2022 Prepared by: Kearney Hard   Exercises - Sleeper Stretch  - 2 x daily - 7 x weekly - 3-5 reps - 10 seconds hold - Single Arm Serratus Punches in Supine with Dumbbell  - 2 x daily - 7 x weekly - 3 reps - Shoulder External Rotation and Scapular Retraction with Resistance  - 2 x daily - 7 x weekly - 2 sets - 10 reps - Standing Shoulder Row with Anchored Resistance  - 2 x daily - 7 x weekly - 3 sets - 10 reps - 3 seconds hold - Doorway Pec Stretch at 90 Degrees Abduction  - 2 x daily - 7 x weekly - 3-5 reps - 20 seconds hold   ASSESSMENT:   CLINICAL IMPRESSION: Pt tolerated session well today with expected muscle fatigue, but no increase in pain.  Will continue to benefit from PT to maximize function.    OBJECTIVE IMPAIRMENTS decreased activity tolerance, impaired flexibility, impaired UE functional use, and pain.    ACTIVITY LIMITATIONS carrying, lifting, sleeping, dressing, and reach over head   PARTICIPATION LIMITATIONS: community activity and yard work   PERSONAL FACTORS  DM, HTN, hyperlipidemia, ORIF, ankle fx, toe surgery  are also affecting patient's functional outcome.    REHAB POTENTIAL: Good    CLINICAL DECISION MAKING: Stable/uncomplicated   EVALUATION COMPLEXITY: Low     GOALS: Goals reviewed with patient? Yes   SHORT TERM GOALS: Target date: 05/23/2022  (Remove Blue Hyperlink)   Patient will demonstrate independent use of initial home exercise program to maintain progress from in clinic treatments. Goal status: Ongoing 05/16/22   Long term PT goals:  Target Date:  06/15/2022: Patient will demonstrate/report pain at worst less than or equal to 2/10 to facilitate minimal limitation in daily activity secondary to pain symptoms. Goal status: New  Patient will demonstrate independent use of home exercise program to facilitate ability to maintain/progress functional gains from skilled physical therapy services. Goal status: New   Patient will demonstrate FOTO outcome > or = 74% to indicate reduced disability due to condition. Goal status: New   Pt will be able to improve his Rt shoulder ER to >/= 75 degrees to improve functional mobility.  Goal status: MET 05/16/22       5.  Pt will be able to donne his pants with belt with no shoulder pain.   Goal status: New   PLAN: PT FREQUENCY: 1x/week   PT DURATION: 6 weeks   PLANNED INTERVENTIONS: Therapeutic exercises, Therapeutic activity, Neuromuscular re-education, Balance training, Gait training, Patient/Family education, Joint mobilization, Dry Needling, Electrical stimulation, Cryotherapy, Moist heat, Taping, Ultrasound, and Manual therapy   PLAN FOR NEXT SESSION: continue strengthening       Laureen Abrahams, PT, DPT 05/23/22 9:07 AM  PHYSICAL THERAPY DISCHARGE SUMMARY  Visits from Start of Care: 3  Current functional level related to goals / functional outcomes: See above   Remaining deficits: See above   Education / Equipment: HEP   Patient agrees to discharge. Patient goals were not met. Patient is being discharged due to not returning since the last visit.

## 2022-06-06 ENCOUNTER — Encounter: Payer: 59 | Admitting: Physical Therapy

## 2022-06-06 ENCOUNTER — Telehealth: Payer: Self-pay | Admitting: Physical Therapy

## 2022-06-06 NOTE — Telephone Encounter (Signed)
I called Pt to follow up after he missed his 8:00 appointment this morning. Pt has no further PT visits scheduled. I instructed pt to call to schedule further visits.  Kearney Hard, PT, MPT 06/06/22 11:38 AM

## 2022-06-06 NOTE — Therapy (Deleted)
OUTPATIENT PHYSICAL THERAPY TREATMENT NOTE   Patient Name: Cameron Mcneil MRN: [id number] DOB:10-Aug-1966, 56 y.o., male Today's Date: 05/16/2022  PCP: Elsie Stain MD REFERRING PROVIDER: Joni Fears MD  END OF SESSION:       Past Medical History:  Diagnosis Date   Allergic rhinitis    Diabetes mellitus without complication ([facility])    pre-diabetic =no meds per pt   ED (erectile dysfunction)    Hyperlipidemia    Hypertension    Hypogonadism male    Past Surgical History:  Procedure Laterality Date   COLONOSCOPY  05/17/2021   poor prep- repeated on 06/09/21 Dr Ardis Hughs   ORIF ANKLE FRACTURE Right 11/12/2013   Procedure: OPEN REDUCTION INTERNAL FIXATION (ORIF) ANKLE FRACTURE/ irrigation and debridement of right forefoot wound  dehisence;  Surgeon: Wylene Simmer, MD;  Location: [facility];  Service: Orthopedics;  Laterality: Right;   TOE SURGERY Right 11/06/2013   MPJ     HALLUX           DR  HYATT   Patient Active Problem List   Diagnosis Date Noted   Upper respiratory tract infection 04/27/2022   Tick bite 04/27/2022   Nontraumatic incomplete tear of right rotator cuff 04/18/2022   Prostate enlargement 03/22/2022   Urinary retention 03/22/2022   Persistent proteinuria 03/22/2022   Diabetes mellitus without complication ([facility]) 23/53/6144   Benign essential hypertension 01/22/2017   HLD (hyperlipidemia) 08/24/2015   Advance care planning 08/24/2015   Obesity 02/21/2013   Hypogonadism in male 01/10/2009   ERECTILE DYSFUNCTION 12/30/2008    REFERRING DIAG: M25.511 (ICD-10-CM) - Acute pain of right shoulder  THERAPY DIAG:  No diagnosis found.   Rationale for Evaluation and Treatment Rehabilitation  PERTINENT HISTORY: DM, HTN, hyperlipidemia, ORIF, ankle fx, toe surgery   PRECAUTIONS: none  SUBJECTIVE: only having pain with lying on Rt side   PAIN:  Are you having pain? No: NPRS scale: 0/10   OBJECTIVE: (objective measures completed at initial evaluation unless  otherwise dated)   PATIENT SURVEYS:  05/02/2022: FOTO 59%, (predicted 74%)   POSTURE: 05/02/22: Mild forward shoulders and head   UPPER EXTREMITY ROM:    Active ROM Right eval Left eval Right 05/16/22  Shoulder flexion 150 160 155  Shoulder extension 48 50   Shoulder abduction 168 170   Shoulder adduction       Shoulder internal rotation 50 72 85  Shoulder external rotation 66 82 82  (Blank rows = not tested)   UPPER EXTREMITY MMT:   MMT Right eval Left eval  Shoulder flexion 5//5 5/5  Shoulder extension 5/5 5/5  Shoulder abduction      Shoulder adduction      Shoulder internal rotation 4+/5 4+/5  Shoulder external rotation 4+/5 4+/5  Grip strength (lbs) 132.2 133.2  (Blank rows = not tested) Rt sided dominate     SHOULDER SPECIAL TESTS: 05/02/2022:             Negative: Empty Can on Rt             Negative Neers on Rt             TODAY'S TREATMENT:  06/06/22 Therex:      Aerobic: UBE L5 x 8 min (alt 2' fwd/bwd)     Sidelying: Rt shoulder IR sleeper stretch 3x20 sec ER ball toss (2#) 3x20 sec     Sitting: Cable rows 55# 3x10 Lat pull downs 65# 3x10     Standing: ER 5# cable 3x10 -  mod cues for technique IR 10# on cable 3x10 Rows 55# on cable 3x10 Horizontal abduction/adduction 3x10 (5# on Lt, 3# on Rt) IR stretch with strap 3x20 sec Overhead press 3x10 (5# on Lt, 3# on Rt) L2 band half circles x 20 reps each direction   05/23/22 Therex:      Aerobic: UBE L5 x 8 min (alt 2' fwd/bwd)     Sidelying: Rt shoulder IR sleeper stretch 3x20 sec ER ball toss (2#) 3x20 sec     Sitting: Cable rows 55# 3x10 Lat pull downs 65# 3x10     Standing: ER 5# cable 3x10 - mod cues for technique IR 10# on cable 3x10 Rows 55# on cable 3x10 Horizontal abduction/adduction 3x10 (5# on Lt, 3# on Rt) IR stretch with strap 3x20 sec Overhead press 3x10 (5# on Lt, 3# on Rt) L2 band half circles x 20 reps each direction  05/16/22 Therex:      Aerobic: UBE L5 x 8 min (3'  fwd, 3' bwd, then 1 min fwd/back)     Supine: Rt shoulder IR sleeper stretch 5x15 sec Serratus punches 10# on Rt 3x10     Sitting: Bil ER with L4 band 2x10 (mild discomfort) Cable rows 35# x 10; then 55# 2x10 Lat pull downs 65# 3x10     Standing: Rows with L4 band 3x10 Overhead press (forward flexion hold) 5# bil 3x10 Abduction bil (5# Lt; 3# Rt) 3x10   05/02/22 HEP instruction/performance c cues for techniques, handout provided.  Trial set performed of each for comprehension and symptom assessment.  See below for exercise list.     PATIENT EDUCATION: Education details: PT POC, HEP Person educated: Patient Education method: Explanation, Demonstration, Tactile cues, Verbal cues, and Handouts Education comprehension: verbalized understanding and returned demonstration     HOME EXERCISE PROGRAM: Access Code: [email or web address] URL: [email or web address] Date: 05/02/2022 Prepared by: Kearney Hard   Exercises - Sleeper Stretch  - 2 x daily - 7 x weekly - 3-5 reps - 10 seconds hold - Single Arm Serratus Punches in Supine with Dumbbell  - 2 x daily - 7 x weekly - 3 reps - Shoulder External Rotation and Scapular Retraction with Resistance  - 2 x daily - 7 x weekly - 2 sets - 10 reps - Standing Shoulder Row with Anchored Resistance  - 2 x daily - 7 x weekly - 3 sets - 10 reps - 3 seconds hold - Doorway Pec Stretch at 90 Degrees Abduction  - 2 x daily - 7 x weekly - 3-5 reps - 20 seconds hold   ASSESSMENT:   CLINICAL IMPRESSION: Pt tolerated session well today with expected muscle fatigue, but no increase in pain.  Will continue to benefit from PT to maximize function.    OBJECTIVE IMPAIRMENTS decreased activity tolerance, impaired flexibility, impaired UE functional use, and pain.    ACTIVITY LIMITATIONS carrying, lifting, sleeping, dressing, and reach over head   PARTICIPATION LIMITATIONS: community activity and yard work   PERSONAL FACTORS  DM, HTN, hyperlipidemia,  ORIF, ankle fx, toe surgery  are also affecting patient's functional outcome.    REHAB POTENTIAL: Good   CLINICAL DECISION MAKING: Stable/uncomplicated   EVALUATION COMPLEXITY: Low     GOALS: Goals reviewed with patient? Yes   SHORT TERM GOALS: Target date: 05/23/2022  (Remove Blue Hyperlink)   Patient will demonstrate independent use of initial home exercise program to maintain progress from in clinic treatments. Goal status: Ongoing 05/16/22   Long term PT goals:  Target  Date:  06/15/2022: Patient will demonstrate/report pain at worst less than or equal to 2/10 to facilitate minimal limitation in daily activity secondary to pain symptoms. Goal status: New   Patient will demonstrate independent use of home exercise program to facilitate ability to maintain/progress functional gains from skilled physical therapy services. Goal status: New   Patient will demonstrate FOTO outcome > or = 74% to indicate reduced disability due to condition. Goal status: New   Pt will be able to improve his Rt shoulder ER to >/= 75 degrees to improve functional mobility.  Goal status: MET 05/16/22       5.  Pt will be able to donne his pants with belt with no shoulder pain.   Goal status: New   PLAN: PT FREQUENCY: 1x/week   PT DURATION: 6 weeks   PLANNED INTERVENTIONS: Therapeutic exercises, Therapeutic activity, Neuromuscular re-education, Balance training, Gait training, Patient/Family education, Joint mobilization, Dry Needling, Electrical stimulation, Cryotherapy, Moist heat, Taping, Ultrasound, and Manual therapy   PLAN FOR NEXT SESSION: continue strengthening       Laureen Abrahams, PT, DPT 06/06/22 7:50 AM

## 2022-07-13 ENCOUNTER — Encounter: Payer: Self-pay | Admitting: Family Medicine

## 2022-08-31 ENCOUNTER — Telehealth: Payer: Self-pay | Admitting: Family

## 2022-08-31 DIAGNOSIS — I10 Essential (primary) hypertension: Secondary | ICD-10-CM

## 2022-09-02 NOTE — Telephone Encounter (Addendum)
Please get patient scheduled for a yearl cpe in early 2024.  Thanks.

## 2022-09-03 NOTE — Telephone Encounter (Signed)
Called patient lvm to schedule cpe

## 2022-09-05 NOTE — Telephone Encounter (Signed)
LVM for patient to call and schedule CPE.

## 2022-10-16 NOTE — Telephone Encounter (Signed)
Spoke to pt, scheduled cpe for 10/29/22

## 2022-10-21 ENCOUNTER — Other Ambulatory Visit: Payer: Self-pay | Admitting: Family Medicine

## 2022-10-21 DIAGNOSIS — I10 Essential (primary) hypertension: Secondary | ICD-10-CM

## 2022-10-21 DIAGNOSIS — Z125 Encounter for screening for malignant neoplasm of prostate: Secondary | ICD-10-CM

## 2022-10-21 DIAGNOSIS — E119 Type 2 diabetes mellitus without complications: Secondary | ICD-10-CM

## 2022-10-23 ENCOUNTER — Other Ambulatory Visit (INDEPENDENT_AMBULATORY_CARE_PROVIDER_SITE_OTHER): Payer: Self-pay

## 2022-10-23 DIAGNOSIS — Z125 Encounter for screening for malignant neoplasm of prostate: Secondary | ICD-10-CM

## 2022-10-23 DIAGNOSIS — I10 Essential (primary) hypertension: Secondary | ICD-10-CM

## 2022-10-23 DIAGNOSIS — E119 Type 2 diabetes mellitus without complications: Secondary | ICD-10-CM

## 2022-10-23 LAB — COMPREHENSIVE METABOLIC PANEL
ALT: 32 U/L (ref 0–53)
AST: 21 U/L (ref 0–37)
Albumin: 4.7 g/dL (ref 3.5–5.2)
Alkaline Phosphatase: 37 U/L — ABNORMAL LOW (ref 39–117)
BUN: 16 mg/dL (ref 6–23)
CO2: 27 mEq/L (ref 19–32)
Calcium: 9.5 mg/dL (ref 8.4–10.5)
Chloride: 104 mEq/L (ref 96–112)
Creatinine, Ser: 1.03 mg/dL (ref 0.40–1.50)
GFR: 82.48 mL/min (ref >60.00)
Glucose, Bld: 139 mg/dL — ABNORMAL HIGH (ref 70–99)
Potassium: 4.3 mEq/L (ref 3.5–5.1)
Sodium: 139 mEq/L (ref 135–145)
Total Bilirubin: 0.6 mg/dL (ref 0.2–1.2)
Total Protein: 7.4 g/dL (ref 6.0–8.3)

## 2022-10-23 LAB — LIPID PANEL
Cholesterol: 185 mg/dL (ref 0–200)
HDL: 56.5 mg/dL (ref >39.00)
NonHDL: 128.69
Total CHOL/HDL Ratio: 3
Triglycerides: 221 mg/dL — ABNORMAL HIGH (ref 0.0–149.0)
VLDL: 44.2 mg/dL — ABNORMAL HIGH (ref 0.0–40.0)

## 2022-10-23 LAB — MICROALBUMIN / CREATININE URINE RATIO
Creatinine,U: 162.8 mg/dL
Microalb Creat Ratio: 11.6 mg/g (ref 0.0–30.0)
Microalb, Ur: 18.9 mg/dL — ABNORMAL HIGH (ref 0.0–1.9)

## 2022-10-23 LAB — PSA: PSA: 0.51 ng/mL (ref 0.10–4.00)

## 2022-10-23 LAB — LDL CHOLESTEROL, DIRECT: Direct LDL: 91 mg/dL

## 2022-10-23 LAB — HEMOGLOBIN A1C: Hgb A1c MFr Bld: 6.5 % (ref 4.6–6.5)

## 2022-10-24 ENCOUNTER — Other Ambulatory Visit: Payer: Self-pay

## 2022-10-29 ENCOUNTER — Encounter: Payer: Self-pay | Admitting: Family Medicine

## 2022-10-30 ENCOUNTER — Encounter: Payer: Self-pay | Admitting: Family Medicine

## 2022-11-08 ENCOUNTER — Ambulatory Visit (INDEPENDENT_AMBULATORY_CARE_PROVIDER_SITE_OTHER): Payer: Commercial Managed Care - HMO | Admitting: Family Medicine

## 2022-11-08 ENCOUNTER — Encounter: Payer: Self-pay | Admitting: Family Medicine

## 2022-11-08 VITALS — BP 146/92 | HR 64 | Temp 96.7°F | Ht 72.85 in | Wt 270.0 lb

## 2022-11-08 DIAGNOSIS — Z0000 Encounter for general adult medical examination without abnormal findings: Secondary | ICD-10-CM

## 2022-11-08 DIAGNOSIS — Z7189 Other specified counseling: Secondary | ICD-10-CM

## 2022-11-08 DIAGNOSIS — I10 Essential (primary) hypertension: Secondary | ICD-10-CM

## 2022-11-08 DIAGNOSIS — E1129 Type 2 diabetes mellitus with other diabetic kidney complication: Secondary | ICD-10-CM

## 2022-11-08 DIAGNOSIS — R0683 Snoring: Secondary | ICD-10-CM

## 2022-11-08 DIAGNOSIS — R319 Hematuria, unspecified: Secondary | ICD-10-CM

## 2022-11-08 MED ORDER — AMLODIPINE BESYLATE 5 MG PO TABS: 5.0000 mg | ORAL_TABLET | Freq: Every day | ORAL | Status: DC

## 2022-11-08 MED ORDER — LOSARTAN POTASSIUM 100 MG PO TABS: 50.0000 mg | ORAL_TABLET | Freq: Every day | ORAL | 1 refills | Status: DC

## 2022-11-08 NOTE — Patient Instructions (Addendum)
Let me know if you don't get a call by next week about seeing urology and pulmonary.   Hold amlodipine.  Start '50mg'$  losartan for 2 weeks.  If BP still above 140/90, then increase to '100mg'$  for 2 weeks.   If still above 140/90, then add back amlodipine.   If still not controlled, then let me know.  Take care.  Glad to see you.  Recheck in about 3 months.  A1c at the visit.

## 2022-11-08 NOTE — Progress Notes (Signed)
CPE- See plan.  Routine anticipatory guidance given to patient.  See health maintenance.  The possibility exists that previously documented standard health maintenance information may have been brought forward from a previous encounter into this note.  If needed, that same information has been updated to reflect the current situation based on today's encounter.   Tetanus 2020 Flu declined.  Encourage.  He only wants to do things that are "natural".  I still advised him to get vaccinated. PNA d/w pt. if he is not diabetic is not indicated.  Discussed. Shingles d/w pt.   covid vaccine done.    Living will d/w pt.  If patient incapacitated, then [employer].   Diet and exercise d/w pt.   Colonoscopy 2022.   PSA 2023.  D/w pt about OSA eval.  Snores with weight up.  Encouraged eval.  Discussed how this could affect diabetes, blood pressure, etc.    Taking maca and boron supplements.  Diabetes:  No meds.   Hypoglycemic episodes: no sx.   Hyperglycemic episodes: no sx Feet problems: some tingling after drinking soda, not noted when avoiding soda.   Blood Sugars averaging: no eye exam within last year: seeing Dr. Ellie Lunch yearly.   MALB positive.  D/w pt.   Hypertension:    Using medication without problems or lightheadedness: yes Chest pain with exertion:no Edema:no Short of breath:no  He had had recurrent blood in urine over the last few months. PSA wnl.    PMH and SH reviewed  Meds, vitals, and allergies reviewed.   ROS: Per HPI.  Unless specifically indicated otherwise in HPI, the patient denies:  General: fever. Eyes: acute vision changes ENT: sore throat Cardiovascular: chest pain Respiratory: SOB GI: vomiting GU: dysuria Musculoskeletal: acute back pain Derm: acute rash Neuro: acute motor dysfunction Psych: worsening mood Endocrine: polydipsia Heme: bleeding Allergy: hayfever  GEN: nad, alert and oriented HEENT: ncat NECK: supple w/o LA CV:  rrr. PULM: ctab, no inc wob ABD: soft, +bs EXT: no edema SKIN: no acute rash  Diabetic foot exam: Normal inspection No skin breakdown No calluses  Normal DP pulses Normal sensation to light touch and monofilament Nails normal

## 2022-11-11 DIAGNOSIS — R0683 Snoring: Secondary | ICD-10-CM | POA: Insufficient documentation

## 2022-11-11 NOTE — Assessment & Plan Note (Signed)
Living will d/w pt.  If patient incapacitated, then [employer].

## 2022-11-11 NOTE — Assessment & Plan Note (Signed)
Start losartan 50 mg. If blood pressure not controlled increase to 100 mg. If blood pressure still not controlled add back amlodipine. Discussed. See after visit summary.

## 2022-11-11 NOTE — Assessment & Plan Note (Signed)
Tetanus 2020 Flu declined.  Encourage.  He only wants to do things that are "natural".  I still advised him to get vaccinated. PNA d/w pt. if he is not diabetic is not indicated.  Discussed. Shingles d/w pt.   covid vaccine done.    Living will d/w pt.  If patient incapacitated, then [employer].   Diet and exercise d/w pt.   Colonoscopy 2022.   PSA 2023.

## 2022-11-11 NOTE — Assessment & Plan Note (Signed)
Refer for sleep apnea evaluation. 

## 2022-11-11 NOTE — Assessment & Plan Note (Signed)
Start losartan 50 mg.  If blood pressure not controlled increase to 100 mg.  If blood pressure still not controlled add back amlodipine.  Discussed.  See after visit summary. Continue work on diet and exercise. Recheck in about 3 months.  A1c at the visit.

## 2022-11-11 NOTE — Assessment & Plan Note (Signed)
Refer to urology.  ?

## 2022-11-12 ENCOUNTER — Encounter: Payer: Self-pay | Admitting: *Deleted

## 2022-11-16 NOTE — Telephone Encounter (Signed)
Referral sent to [facility]

## 2022-11-16 NOTE — Telephone Encounter (Signed)
Pt called he was contacted by [facility]  and was told they don't accept his insurance [employer] . He would need to find a practice that would . Please advise [phone]

## 2022-12-06 ENCOUNTER — Encounter: Payer: Self-pay | Admitting: Urology

## 2022-12-06 ENCOUNTER — Ambulatory Visit: Payer: Commercial Managed Care - HMO | Admitting: Urology

## 2022-12-06 VITALS — BP 173/106 | HR 88 | Ht 74.0 in | Wt 260.0 lb

## 2022-12-06 DIAGNOSIS — R310 Gross hematuria: Secondary | ICD-10-CM

## 2022-12-06 DIAGNOSIS — R3915 Urgency of urination: Secondary | ICD-10-CM | POA: Diagnosis not present

## 2022-12-06 DIAGNOSIS — R350 Frequency of micturition: Secondary | ICD-10-CM | POA: Diagnosis not present

## 2022-12-06 DIAGNOSIS — R399 Unspecified symptoms and signs involving the genitourinary system: Secondary | ICD-10-CM

## 2022-12-06 DIAGNOSIS — R300 Dysuria: Secondary | ICD-10-CM

## 2022-12-06 DIAGNOSIS — R319 Hematuria, unspecified: Secondary | ICD-10-CM

## 2022-12-06 LAB — MICROSCOPIC EXAMINATION: RBC, Urine: >30 /hpf — AB (ref 0–2)

## 2022-12-06 LAB — URINALYSIS, COMPLETE
Bilirubin, UA: NEGATIVE
Glucose, UA: NEGATIVE
Ketones, UA: NEGATIVE
Leukocytes,UA: NEGATIVE
Nitrite, UA: NEGATIVE
Specific Gravity, UA: 1.03 (ref 1.005–1.030)
Urobilinogen, Ur: 0.2 mg/dL (ref 0.2–1.0)
pH, UA: 5 (ref 5.0–7.5)

## 2022-12-06 MED ORDER — TAMSULOSIN HCL 0.4 MG PO CAPS: 0.4000 mg | ORAL_CAPSULE | Freq: Every day | ORAL | 0 refills | Status: DC

## 2022-12-06 NOTE — Progress Notes (Signed)
12/06/2022 2:09 PM   Cameron Mcneil 1966-04-02 [id number]  Referring provider: Tonia Ghent, MD [place],  [place] [address]  Chief Complaint  Patient presents with   Hematuria    HPI: Cameron Mcneil is a 57 y.o. male referred for evaluation of hematuria.  Intermittent gross hematuria for several months.  He will note drops of blood at the end of urination intermittently Also notes intermittent dysuria, frequency and urgency UA PCP/2023 showed 7-10 WBC/7-10 RBC.  Urine culture was negative PSA 10/23/2022 0.51 RUS 04/16/2022 showed simple cyst of the right kidney and no upper tract abnormalities or hydronephrosis.  A prior renal ultrasound December 2022 was felt to show bilateral renal calculi No prior smoking history Creatinine 09/2022 1.03   PMH: Past Medical History:  Diagnosis Date   Allergic rhinitis    Diabetes mellitus without complication ([facility])    pre-diabetic =no meds per pt   ED (erectile dysfunction)    Hyperlipidemia    Hypertension    Hypogonadism male     Surgical History: Past Surgical History:  Procedure Laterality Date   COLONOSCOPY  05/17/2021   poor prep- repeated on 06/09/21 Dr Ardis Hughs   ORIF ANKLE FRACTURE Right 11/12/2013   Procedure: OPEN REDUCTION INTERNAL FIXATION (ORIF) ANKLE FRACTURE/ irrigation and debridement of right forefoot wound  dehisence;  Surgeon: Wylene Simmer, MD;  Location: [facility];  Service: Orthopedics;  Laterality: Right;   TOE SURGERY Right 11/06/2013   MPJ     HALLUX           DR  HYATT    Home Medications:  Allergies as of 12/06/2022   No Known Allergies      Medication List        Accurate as of December 06, 2022  2:09 PM. If you have any questions, ask your nurse or doctor.          amLODipine 5 MG tablet Commonly known as: NORVASC Take 1 tablet (5 mg total) by mouth daily.   losartan 100 MG tablet Commonly known as: COZAAR Take 0.5-1 tablets (50-100 mg total) by mouth daily.    multivitamin tablet Take 1 tablet by mouth daily.        Allergies: No Known Allergies  Family History: Family History  Problem Relation Age of Onset   Diabetes Other    Prostate cancer Neg Hx    Colon cancer Neg Hx    Colon polyps Neg Hx    Esophageal cancer Neg Hx    Rectal cancer Neg Hx    Stomach cancer Neg Hx     Social History:  reports that he has never smoked. He has never used smokeless tobacco. He reports current alcohol use. He reports that he does not use drugs.   Physical Exam: BP (!) 173/106   Pulse 88   Ht '6\' 2"'$  (1.88 m)   Wt 260 lb (117.9 kg)   BMI 33.38 kg/m   Constitutional:  Alert and oriented, No acute distress. HEENT: [place] AT Respiratory: Normal respiratory effort, no increased work of breathing. GU: Prostate 40 g, smooth without nodules Skin: No rashes, bruises or suspicious lesions. Neurologic: Grossly intact, no focal deficits, moving all 4 extremities. Psychiatric: Normal mood and affect.  Laboratory Data:  Urinalysis Dipstick 3+ blood/2+ protein Microscopy >30 RBC   Pertinent Imaging: Images were personally reviewed and interpreted   DG Abd 1 View  Narrative CLINICAL DATA:  Evaluation for renal stone.  EXAM: ABDOMEN -  1 VIEW  COMPARISON:  CT 06/07/2011.  FINDINGS: No bowel distention. Stool noted throughout the colon making evaluation for stone disease difficult. Very questionable faint calcific densities noted over the medial aspect of the left kidney and inferior aspect of the right kidney. Nephrolithiasis cannot be completely excluded. Pelvic calcifications are noted, these are most likely phleboliths. Distal ureteral stones cannot be excluded. Mild degenerative changes lumbar spine.  IMPRESSION: Stool noted throughout the colon making evaluation for stone disease difficult. Very questionable faint calcific densities noted over the medial aspect of the left kidney and inferior aspect of the right kidney.  Nephrolithiasis cannot be excluded. Further evaluation with ultrasound can be obtained as needed.   Electronically Signed By: Marcello Moores  Register M.D. On: 10/24/2021 07:25  No results found for this or any previous visit.  No results found for this or any previous visit.  No results found for this or any previous visit.  Results for orders placed during the hospital encounter of 04/16/22  US RENAL  Narrative CLINICAL DATA:  Urinary retention  EXAM: RENAL / URINARY TRACT ULTRASOUND COMPLETE  COMPARISON:  November 07, 2021  FINDINGS: Right Kidney:  Renal measurements: 11.1 x 6.6 x 7.3 cm = volume: 281 mL. Contains 2 simple cysts measuring 2.1 and 2.3 cm. The simple cysts are of no clinical significance. No follow-up imaging recommended.  Left Kidney:  Renal measurements: 11.7 x 5.9 x 5.3 cm = volume: 194 mL. Echogenicity within normal limits. No mass or hydronephrosis visualized.  Bladder:  Appears normal for degree of bladder distention.  Other:  None.  IMPRESSION: No cause for urinary retention identified. No acute abnormalities. Postvoid images were not obtained of the bladder.   Electronically Signed By: Dorise Bullion III M.D. On: 04/17/2022 08:12   Assessment & Plan:   57 y.o. male with intermittent gross hematuria associated with dysuria, frequency and urgency Recent renal ultrasound was negative but a prior renal ultrasound December 2022 suggested bilateral renal calculi Recommend further evaluation with CT urogram and cystoscopy.  The procedures were discussed in detail Trial tamsulosin 0.4 mg daily for his voiding symptoms   Abbie Sons, MD  [facility] [address], [place], [place] [address] [phone]

## 2022-12-07 ENCOUNTER — Encounter: Payer: Self-pay | Admitting: *Deleted

## 2022-12-12 ENCOUNTER — Ambulatory Visit (INDEPENDENT_AMBULATORY_CARE_PROVIDER_SITE_OTHER): Payer: Commercial Managed Care - HMO | Admitting: Adult Health

## 2022-12-12 ENCOUNTER — Encounter: Payer: Self-pay | Admitting: Adult Health

## 2022-12-12 VITALS — BP 140/90 | HR 72 | Temp 97.8°F | Ht 74.0 in | Wt 269.6 lb

## 2022-12-12 DIAGNOSIS — R0683 Snoring: Secondary | ICD-10-CM | POA: Diagnosis not present

## 2022-12-12 NOTE — Patient Instructions (Signed)
Set up for home sleep study Work on healthy weight loss Healthy sleep regimen Do not drive if sleepy Follow-up in 3 months to discuss sleep study results and treatment plan

## 2022-12-12 NOTE — Progress Notes (Signed)
$'@Patient'U$  ID: Cameron Mcneil, male    DOB: [birth year], 57 y.o.   MRN: [id number]  Chief Complaint  Patient presents with   Consult    Referring provider: Tonia Ghent, MD  HPI: 57 year old male seen for sleep consult December 12, 2022 for snoring, restless sleep, witnessed apneic events, daytime sleepiness  TEST/EVENTS :   12/12/2022 Sleep Consult  Patient presents for sleep consult.  Kindly referred by primary care provider Dr. Damita Dunnings.  Patient complains of snoring, restless sleep, witnessed apneic events and daytime sleepiness.  Patient has very fragmented sleep and feels tired a lot.  Goes to bed about midnight.  Takes 15 minutes to go to sleep.  Is up a couple times throughout the night.  Gets up at 7:30 AM.  Weight is up 10 pounds over the last 2 years.  Current weight is at 269 pounds with a BMI at 34.  Has never had a sleep study before.  Has no history of congestive heart failure or stroke.  Has minimal caffeine intake.  No symptoms suspicious for cataplexy or sleep paralysis.Marland Kitchen  Epworth score is 9 out of 24.  Typically gets sleepy if he sits down to watch TV and in the evening hours.  Patient does not nap.  Takes melatonin on occasion, but does not like to take because makes him drowsy. No dental work.   Medical history significant for hypertension, diabetes, hyperlipidemia  Surgical history right ankle surgery  Social history patient is single.  Has 2 kids .  Is self-employed- .  Patient is a never smoker.  Minimum alcohol.  No drug use.  Family history positive for Hypertension.     No Known Allergies  Immunization History  Administered Date(s) Administered   Influenza-Unspecified 07/05/2017   PFIZER(Purple Top)SARS-COV-2 Vaccination 07/28/2020, 08/24/2020   Td 08/23/2008   Tdap 12/02/2018    Past Medical History:  Diagnosis Date   Allergic rhinitis    Diabetes mellitus without complication ([facility])    pre-diabetic =no meds per pt   ED (erectile dysfunction)     Hyperlipidemia    Hypertension    Hypogonadism male     Tobacco History: Social History   Tobacco Use  Smoking Status Never  Smokeless Tobacco Never   Counseling given: Not Answered   Outpatient Medications Prior to Visit  Medication Sig Dispense Refill   losartan (COZAAR) 100 MG tablet Take 0.5-1 tablets (50-100 mg total) by mouth daily. 90 tablet 1   Multiple Vitamin (MULTIVITAMIN) tablet Take 1 tablet by mouth daily.     tamsulosin (FLOMAX) 0.4 MG CAPS capsule Take 1 capsule (0.4 mg total) by mouth daily. 30 capsule 0   amLODipine (NORVASC) 5 MG tablet Take 1 tablet (5 mg total) by mouth daily. (Patient not taking: Reported on 12/12/2022)     No facility-administered medications prior to visit.     Review of Systems:   Constitutional:   No  weight loss, night sweats,  Fevers, chills, fatigue, or  lassitude.  HEENT:   No headaches,  Difficulty swallowing,  Tooth/dental problems, or  Sore throat,                No sneezing, itching, ear ache, nasal congestion, post nasal drip,   CV:  No chest pain,  Orthopnea, PND, swelling in lower extremities, anasarca, dizziness, palpitations, syncope.   GI  No heartburn, indigestion, abdominal pain, nausea, vomiting, diarrhea, change in bowel habits, loss of appetite, bloody stools.   Resp: No shortness of breath  with exertion or at rest.  No excess mucus, no productive cough,  No non-productive cough,  No coughing up of blood.  No change in color of mucus.  No wheezing.  No chest wall deformity  Skin: no rash or lesions.  GU: no dysuria, change in color of urine, no urgency or frequency.  No flank pain, no hematuria   MS:  No joint pain or swelling.  No decreased range of motion.  No back pain.    Physical Exam  BP (!) 140/90 (BP Location: Left Arm, Patient Position: Sitting, Cuff Size: Large)   Pulse 72   Temp 97.8 F (36.6 C) (Oral)   Ht '6\' 2"'$  (1.88 m)   Wt 269 lb 9.6 oz (122.3 kg)   SpO2 96%   BMI 34.61 kg/m   GEN:  A/Ox3; pleasant , NAD, well nourished    HEENT:  [place]/AT,  EACs-clear, TMs-wnl, NOSE-clear, THROAT-clear, no lesions, no postnasal drip or exudate noted.  Class 3 MP airway   NECK:  Supple w/ fair ROM; no JVD; normal carotid impulses w/o bruits; no thyromegaly or nodules palpated; no lymphadenopathy.    RESP  Clear  P & A; w/o, wheezes/ rales/ or rhonchi. no accessory muscle use, no dullness to percussion  CARD:  RRR, no m/r/g, no peripheral edema, pulses intact, no cyanosis or clubbing.  GI:   Soft & nt; nml bowel sounds; no organomegaly or masses detected.   Musco: Warm bil, no deformities or joint swelling noted.   Neuro: alert, no focal deficits noted.    Skin: Warm, no lesions or rashes    Lab Results:  CBC   ProBNP No results found for: "PROBNP"  Imaging: No results found.        No data to display          No results found for: "NITRICOXIDE"      Assessment & Plan:   Snoring Snoring , restless sleep , witnessed apneic events and daytime sleepiness   Plan  Patient Instructions  Set up for home sleep study Work on healthy weight loss Healthy sleep regimen Do not drive if sleepy Follow-up in 3 months to discuss sleep study results and treatment plan    Morbid obesity ([facility]) Healthy weight loss     Rexene Edison, NP 12/12/2022

## 2022-12-12 NOTE — Assessment & Plan Note (Signed)
Snoring , restless sleep , witnessed apneic events and daytime sleepiness   Plan  Patient Instructions  Set up for home sleep study Work on healthy weight loss Healthy sleep regimen Do not drive if sleepy Follow-up in 3 months to discuss sleep study results and treatment plan

## 2022-12-12 NOTE — Assessment & Plan Note (Signed)
Healthy weight loss 

## 2023-01-01 ENCOUNTER — Ambulatory Visit
Admission: RE | Admit: 2023-01-01 | Discharge: 2023-01-01 | Disposition: A | Discharge status: Home / Self Care | Payer: Commercial Managed Care - HMO | Source: Ambulatory Visit | Attending: Urology | Admitting: Urology

## 2023-01-01 DIAGNOSIS — R310 Gross hematuria: Secondary | ICD-10-CM | POA: Insufficient documentation

## 2023-01-01 DIAGNOSIS — R399 Unspecified symptoms and signs involving the genitourinary system: Secondary | ICD-10-CM

## 2023-01-01 MED ORDER — IOHEXOL 300 MG/ML  SOLN
125.0000 mL | Freq: Once | INTRAMUSCULAR | Status: AC | PRN
  Administered 2023-01-01: 125 mL via INTRAVENOUS

## 2023-01-03 ENCOUNTER — Other Ambulatory Visit: Payer: Self-pay | Admitting: Urology

## 2023-01-09 LAB — POCT I-STAT CREATININE: Creatinine, Ser: 1.2 mg/dL (ref 0.61–1.24)

## 2023-01-11 ENCOUNTER — Other Ambulatory Visit: Payer: Commercial Managed Care - HMO | Admitting: Urology

## 2023-02-11 ENCOUNTER — Ambulatory Visit (INDEPENDENT_AMBULATORY_CARE_PROVIDER_SITE_OTHER): Payer: Commercial Managed Care - HMO

## 2023-02-11 DIAGNOSIS — G4733 Obstructive sleep apnea (adult) (pediatric): Secondary | ICD-10-CM | POA: Diagnosis not present

## 2023-02-11 DIAGNOSIS — R0683 Snoring: Secondary | ICD-10-CM

## 2023-02-13 ENCOUNTER — Other Ambulatory Visit: Payer: Commercial Managed Care - HMO | Admitting: Urology

## 2023-02-13 ENCOUNTER — Encounter: Payer: Self-pay | Admitting: Urology

## 2023-03-13 ENCOUNTER — Ambulatory Visit: Payer: Self-pay | Admitting: Adult Health

## 2023-03-20 ENCOUNTER — Encounter: Payer: Self-pay | Admitting: Adult Health

## 2023-05-06 ENCOUNTER — Encounter: Payer: Self-pay | Admitting: Family Medicine

## 2023-05-06 ENCOUNTER — Ambulatory Visit: Payer: 59 | Admitting: Family Medicine

## 2023-05-06 VITALS — BP 124/84 | HR 64 | Temp 97.8°F | Ht 74.0 in | Wt 274.0 lb

## 2023-05-06 DIAGNOSIS — R809 Proteinuria, unspecified: Secondary | ICD-10-CM | POA: Diagnosis not present

## 2023-05-06 DIAGNOSIS — R319 Hematuria, unspecified: Secondary | ICD-10-CM

## 2023-05-06 DIAGNOSIS — E1129 Type 2 diabetes mellitus with other diabetic kidney complication: Secondary | ICD-10-CM

## 2023-05-06 LAB — CBC WITH DIFFERENTIAL/PLATELET
Basophils Absolute: 0 10*3/uL (ref 0.0–0.1)
Basophils Relative: 0.5 % (ref 0.0–3.0)
Eosinophils Absolute: 0.1 10*3/uL (ref 0.0–0.7)
Eosinophils Relative: 1.5 % (ref 0.0–5.0)
HCT: 50 % (ref 39.0–52.0)
Hemoglobin: 16.3 g/dL (ref 13.0–17.0)
Lymphocytes Relative: 25.2 % (ref 12.0–46.0)
Lymphs Abs: 1.7 10*3/uL (ref 0.7–4.0)
MCHC: 32.7 g/dL (ref 30.0–36.0)
MCV: 81.7 fl (ref 78.0–100.0)
Monocytes Absolute: 0.5 10*3/uL (ref 0.1–1.0)
Monocytes Relative: 7.5 % (ref 3.0–12.0)
Neutro Abs: 4.4 10*3/uL (ref 1.4–7.7)
Neutrophils Relative %: 65.3 % (ref 43.0–77.0)
Platelets: 226 10*3/uL (ref 150.0–400.0)
RBC: 6.12 Mil/uL — ABNORMAL HIGH (ref 4.22–5.81)
RDW: 13.9 % (ref 11.5–15.5)
WBC: 6.7 10*3/uL (ref 4.0–10.5)

## 2023-05-06 LAB — BASIC METABOLIC PANEL
BUN: 18 mg/dL (ref 6–23)
CO2: 27 mEq/L (ref 19–32)
Calcium: 9.5 mg/dL (ref 8.4–10.5)
Chloride: 102 mEq/L (ref 96–112)
Creatinine, Ser: 1.18 mg/dL (ref 0.40–1.50)
GFR: 69.8 mL/min (ref >60.00)
Glucose, Bld: 139 mg/dL — ABNORMAL HIGH (ref 70–99)
Potassium: 4.5 mEq/L (ref 3.5–5.1)
Sodium: 139 mEq/L (ref 135–145)

## 2023-05-06 LAB — HEMOGLOBIN A1C: Hgb A1c MFr Bld: 7.2 % — ABNORMAL HIGH (ref 4.6–6.5)

## 2023-05-06 MED ORDER — LOSARTAN POTASSIUM 100 MG PO TABS: 50.0000 mg | ORAL_TABLET | Freq: Every day | ORAL | Status: DC

## 2023-05-06 NOTE — Patient Instructions (Addendum)
Go to the lab on the way out.   If you have mychart we'll likely use that to update you.    Take care.  Glad to see you. Call urology about the scope and let me know if you have trouble getting scheduled.  Check on an eye exam.   Let me know which meter lancets and strips are covered.

## 2023-05-06 NOTE — Progress Notes (Unsigned)
Intermittent dysuria.  H/o hematuria, intermittently visible- variable color.  Sometimes he'll pass a clot.  No FCNAVD.  Some occ R lower abd pain with urination but not always.    Prev CT with  IMPRESSION: 1. No acute abdominopelvic findings. No urolithiasis, filling defects, or urinary tract dilation. No suspicious renal or urothelial lesions. 2. Enlarged prostate gland. 3. Age indeterminate L1 superior endplate deformity.  Prev u/s with  Right Kidney: Renal measurements: 11.1 x 6.6 x 7.3 cm = volume: 281 mL. Contains 2 simple cysts measuring 2.1 and 2.3 cm. The simple cysts are of no clinical significance. No follow-up imaging recommended.   Left Kidney: Renal measurements: 11.7 x 5.9 x 5.3 cm = volume: 194 mL. Echogenicity within normal limits. No mass or hydronephrosis visualized.  Diabetes:  No meds Hypoglycemic episodes: not sx.  Hyperglycemic episodes: not sx.   Feet problems: some tingling occ Blood Sugars averaging: not checked.  eye exam within last year: d/w pt.  Previous A1c discussed with patient.  Repeat A1c pending.  Meds, vitals, and allergies reviewed.   ROS: Per HPI unless specifically indicated in ROS section   GEN: nad, alert and oriented HEENT: ncat NECK: supple w/o LA CV: rrr. PULM: ctab, no inc wob ABD: soft, +bs EXT: no edema SKIN: well perfused.

## 2023-05-08 NOTE — Assessment & Plan Note (Addendum)
I asked him to call urology about the scope and let me know if having trouble getting scheduled.  Rationale on previous workup discussed with patient.  See notes on follow-up CBC and creatinine.

## 2023-05-08 NOTE — Assessment & Plan Note (Signed)
Continue work on diet.  See notes on labs.

## 2023-06-08 ENCOUNTER — Other Ambulatory Visit: Payer: Self-pay | Admitting: Family Medicine

## 2023-07-16 ENCOUNTER — Ambulatory Visit: Payer: 59 | Admitting: Physician Assistant

## 2023-07-16 ENCOUNTER — Ambulatory Visit
Admission: RE | Admit: 2023-07-16 | Discharge: 2023-07-16 | Disposition: A | Discharge status: Home / Self Care | Payer: 59 | Source: Ambulatory Visit | Attending: Physician Assistant | Admitting: Physician Assistant

## 2023-07-16 ENCOUNTER — Encounter: Payer: Self-pay | Admitting: Physician Assistant

## 2023-07-16 ENCOUNTER — Other Ambulatory Visit: Payer: Self-pay

## 2023-07-16 ENCOUNTER — Ambulatory Visit
Admission: RE | Admit: 2023-07-16 | Discharge: 2023-07-16 | Disposition: A | Discharge status: Home / Self Care | Payer: 59 | Attending: Physician Assistant | Admitting: Physician Assistant

## 2023-07-16 VITALS — BP 185/112 | HR 66 | Ht 74.0 in | Wt 265.0 lb

## 2023-07-16 DIAGNOSIS — R109 Unspecified abdominal pain: Secondary | ICD-10-CM | POA: Diagnosis not present

## 2023-07-16 DIAGNOSIS — R310 Gross hematuria: Secondary | ICD-10-CM | POA: Diagnosis not present

## 2023-07-16 DIAGNOSIS — R399 Unspecified symptoms and signs involving the genitourinary system: Secondary | ICD-10-CM | POA: Diagnosis not present

## 2023-07-16 LAB — URINALYSIS, COMPLETE
Bilirubin, UA: NEGATIVE
Glucose, UA: NEGATIVE
Ketones, UA: NEGATIVE
Leukocytes,UA: NEGATIVE
Nitrite, UA: NEGATIVE
Specific Gravity, UA: 1.025 (ref 1.005–1.030)
Urobilinogen, Ur: 0.2 mg/dL (ref 0.2–1.0)
pH, UA: 6 (ref 5.0–7.5)

## 2023-07-16 LAB — MICROSCOPIC EXAMINATION: RBC, Urine: >30 /hpf — AB (ref 0–2)

## 2023-07-16 NOTE — Patient Instructions (Signed)

## 2023-07-16 NOTE — Progress Notes (Signed)
07/16/2023 1:36 PM   Cameron Mcneil 06/12/1966 [id number]  CC: Chief Complaint  Patient presents with   Nephrolithiasis   HPI: Cameron Mcneil is a 57 y.o. male with PMH of gross hematuria who presents today for evaluation of a possible acute stone episode.   He was seen by Dr. Lonna Cobb most recently on 12/06/2022 for evaluation of intermittent gross hematuria times several months.  CT urogram and cystoscopy were recommended and he was started on Flomax.  CT urogram him dated 01/01/2023 showed no urolithiasis or hydronephrosis, but he did have an enlarged prostate.  He no-showed his cystoscopy appointment.  He saw his PCP on 05/06/2023 with continued reports of intermittent dysuria and hematuria.  Today he reports right low back pain and tea-colored urine as well as continued, intermittent dysuria and hematuria.   KUB today with no radiopaque urolithiasis.  In-office UA today positive for 3+ blood and 2+ protein; urine microscopy with >30 RBCs/HPF, granular casts, and moderate bacteria.  PMH: Past Medical History:  Diagnosis Date   Allergic rhinitis    Diabetes mellitus without complication (HCC)    pre-diabetic =no meds per pt   ED (erectile dysfunction)    Hyperlipidemia    Hypertension    Hypogonadism male     Surgical History: Past Surgical History:  Procedure Laterality Date   COLONOSCOPY  05/17/2021   poor prep- repeated on 06/09/21 Dr Christella Hartigan   ORIF ANKLE FRACTURE Right 11/12/2013   Procedure: OPEN REDUCTION INTERNAL FIXATION (ORIF) ANKLE FRACTURE/ irrigation and debridement of right forefoot wound  dehisence;  Surgeon: Toni Arthurs, MD;  Location: [facility] OR;  Service: Orthopedics;  Laterality: Right;   TOE SURGERY Right 11/06/2013   MPJ     HALLUX           DR  HYATT    Home Medications:  Allergies as of 07/16/2023   No Known Allergies      Medication List        Accurate as of July 16, 2023  1:36 PM. If you have any questions, ask your nurse or doctor.           losartan 100 MG tablet Commonly known as: COZAAR TAKE 0.5-1 TABLETS (50-100 MG TOTAL) BY MOUTH DAILY.   multivitamin tablet Take 1 tablet by mouth daily.        Allergies:  No Known Allergies  Family History: Family History  Problem Relation Age of Onset   Diabetes Other    Prostate cancer Neg Hx    Colon cancer Neg Hx    Colon polyps Neg Hx    Esophageal cancer Neg Hx    Rectal cancer Neg Hx    Stomach cancer Neg Hx     Social History:   reports that he has never smoked. He has never used smokeless tobacco. He reports current alcohol use. He reports that he does not use drugs.  Physical Exam: BP (!) 185/112   Pulse 66   Ht 6\' 2"  (1.88 m)   Wt 265 lb (120.2 kg)   BMI 34.02 kg/m   Constitutional:  Alert and oriented, no acute distress, nontoxic appearing HEENT: [place], AT Cardiovascular: No clubbing, cyanosis, or edema Respiratory: Normal respiratory effort, no increased work of breathing Skin: No rashes, bruises or suspicious lesions Neurologic: Grossly intact, no focal deficits, moving all 4 extremities Psychiatric: Normal mood and affect  Laboratory Data: Results for orders placed or performed in visit on 07/16/23  Microscopic Examination   Urine  Result  Value Ref Range   WBC, UA 0-5 0 - 5 /hpf   RBC, Urine >30 (A) 0 - 2 /hpf   Epithelial Cells (non renal) 0-10 0 - 10 /hpf   Casts Present (A) None seen /lpf   Cast Type Granular casts (A) N/A   Bacteria, UA Moderate (A) None seen/Few  Urinalysis, Complete  Result Value Ref Range   Specific Gravity, UA 1.025 1.005 - 1.030   pH, UA 6.0 5.0 - 7.5   Color, UA Amber (A) Yellow   Appearance Ur Cloudy (A) Clear   Leukocytes,UA Negative Negative   Protein,UA 2+ (A) Negative/Trace   Glucose, UA Negative Negative   Ketones, UA Negative Negative   RBC, UA 3+ (A) Negative   Bilirubin, UA Negative Negative   Urobilinogen, Ur 0.2 0.2 - 1.0 mg/dL   Nitrite, UA Negative Negative   Microscopic Examination See  below:    Assessment & Plan:   1. Gross hematuria Intermittent gross hematuria and dysuria with benign CTU earlier this year.  No radiopaque stones on KUB today, and extremely low suspicion that he would have grown new stones since his CT 6 months ago.  Overall low suspicion for acute stone episode today.  At this point, I recommended proceeding with cystoscopy to complete his hematuria workup.  I was very honest with him that the primary purpose of cystoscopy would be to rule out bladder cancer.  We also discussed that BPH can cause gross hematuria.  He expressed understanding and wishes to proceed. - Urinalysis, Complete - CULTURE, URINE COMPREHENSIVE   Return in about 2 weeks (around 07/30/2023) for Cysto with Dr. Lonna Cobb.  Carman Ching, PA-C  [facility] [place] [address] [place], [place] [address] [phone]

## 2023-07-19 LAB — CULTURE, URINE COMPREHENSIVE

## 2023-08-08 ENCOUNTER — Ambulatory Visit: Payer: 59 | Admitting: Urology

## 2023-08-08 ENCOUNTER — Encounter: Payer: Self-pay | Admitting: Urology

## 2023-08-08 VITALS — BP 175/123 | HR 75

## 2023-08-08 DIAGNOSIS — N3289 Other specified disorders of bladder: Secondary | ICD-10-CM | POA: Diagnosis not present

## 2023-08-08 DIAGNOSIS — R3129 Other microscopic hematuria: Secondary | ICD-10-CM | POA: Diagnosis not present

## 2023-08-08 DIAGNOSIS — R310 Gross hematuria: Secondary | ICD-10-CM | POA: Diagnosis not present

## 2023-08-08 LAB — URINALYSIS, COMPLETE
Bilirubin, UA: NEGATIVE
Glucose, UA: NEGATIVE
Ketones, UA: NEGATIVE
Nitrite, UA: NEGATIVE
Specific Gravity, UA: 1.025 (ref 1.005–1.030)
Urobilinogen, Ur: 0.2 mg/dL (ref 0.2–1.0)
pH, UA: 7 (ref 5.0–7.5)

## 2023-08-08 LAB — MICROSCOPIC EXAMINATION: RBC, Urine: >30 /HPF — AB (ref 0–2)

## 2023-08-08 NOTE — Progress Notes (Unsigned)
   08/08/23  CC:  Chief Complaint  Patient presents with   Cysto    HPI: Refer to [facility] office note 07/16/2023 UA today >30 RBC Blood pressure (!) 175/123, pulse 75. NED. A&Ox3.   No respiratory distress   Abd soft, NT, ND Normal phallus with bilateral descended testicles  Cystoscopy Procedure Note  Patient identification was confirmed, informed consent was obtained, and patient was prepped using Betadine solution.  Lidocaine jelly was administered per urethral meatus.     Pre-Procedure: - Inspection reveals a normal caliber urethral meatus.  Procedure: The flexible cystoscope was introduced without difficulty - No urethral strictures/lesions are present -Moderate lateral lobe enlargement prostate  -Mild elevation bladder neck -Poor visualization secondary to cloudy urine; 100 mL amber urine was aspirated and sent for urine culture/cytology with improvement in visualization -UA was not identified - Bladder mucosa  reveals areas of inflammation/bullous edema and probable papillary tumor - No bladder stones - No trabeculation  Retroflexion shows suboptimal visualization   Post-Procedure: - Patient tolerated the procedure well  Assessment/ Plan: Abnormal bladder mucosa; probable papillary tumor Urine sent for cytology/culture Recommend cystoscopy under anesthesia with bladder biopsy; probable TURBT The procedure was discussed in detail including potential risks of bleeding, infection and bladder injury.  All questions were answered and he desires to proceed  ***Orders to Loleta Rose, MD

## 2023-08-11 ENCOUNTER — Other Ambulatory Visit: Payer: Self-pay | Admitting: Urology

## 2023-08-11 DIAGNOSIS — D494 Neoplasm of unspecified behavior of bladder: Secondary | ICD-10-CM

## 2023-08-11 LAB — CULTURE, URINE COMPREHENSIVE

## 2023-08-11 NOTE — Progress Notes (Signed)
08/08/2023 2:05 PM   Cameron Mcneil 12-20-1965 [id number]  Referring provider: Joaquim Nam, MD [address] [place],  [place] [address]  Chief Complaint  Patient presents with   Cysto    HPI: Cameron Mcneil is a 57 y.o. male with recurrent gross hematuria.  CTU February 2024 showed no abnormalities.  He no-showed for cystoscopy March 2024 and was sent a letter.  He followed up for continued gross hematuria.  Cystoscopy today showed suboptimal visualization however abnormal bladder mucosa with probable papillary tumor.  Cystoscopy under anesthesia and TURBT versus biopsy recommended.   PMH: Past Medical History:  Diagnosis Date   Allergic rhinitis    Diabetes mellitus without complication (HCC)    pre-diabetic =no meds per pt   ED (erectile dysfunction)    Hyperlipidemia    Hypertension    Hypogonadism male     Surgical History: Past Surgical History:  Procedure Laterality Date   COLONOSCOPY  05/17/2021   poor prep- repeated on 06/09/21 Dr Christella Hartigan   ORIF ANKLE FRACTURE Right 11/12/2013   Procedure: OPEN REDUCTION INTERNAL FIXATION (ORIF) ANKLE FRACTURE/ irrigation and debridement of right forefoot wound  dehisence;  Surgeon: Toni Arthurs, MD;  Location: [facility] OR;  Service: Orthopedics;  Laterality: Right;   TOE SURGERY Right 11/06/2013   MPJ     HALLUX           DR  HYATT    Home Medications:  Allergies as of 08/08/2023   No Known Allergies      Medication List        Accurate as of August 08, 2023 11:59 PM. If you have any questions, ask your nurse or doctor.          losartan 100 MG tablet Commonly known as: COZAAR TAKE 0.5-1 TABLETS (50-100 MG TOTAL) BY MOUTH DAILY.   multivitamin tablet Take 1 tablet by mouth daily.        Allergies: No Known Allergies  Family History: Family History  Problem Relation Age of Onset   Diabetes Other    Prostate cancer Neg Hx    Colon cancer Neg Hx    Colon polyps Neg Hx    Esophageal cancer Neg  Hx    Rectal cancer Neg Hx    Stomach cancer Neg Hx     Social History:  reports that he has never smoked. He has never used smokeless tobacco. He reports current alcohol use. He reports that he does not use drugs.   Physical Exam: BP (!) 175/123   Pulse 75   Constitutional:  Alert and oriented, No acute distress. HEENT: [place] AT Respiratory: Normal respiratory effort, no increased work of breathing. Psychiatric: Normal mood and affect.   Assessment & Plan:   57 y.o. male with gross hematuria and cystoscopy with abnormal mucosal and probable bladder tumor. Scheduled for cystoscopy under anesthesia with TURBT versus biopsy/fulguration. The procedure was discussed in detail including potential risks of bleeding, infection and rarely bladder injury.  All questions were answered and he desires to proceed. If he is found to have urothelial carcinoma unlikely will be low risk disease and he will need intravesical chemotherapy.  Intravesical gemcitabine was not ordered.   Riki Altes, MD  [facility] [address] [place], [place] [address] [phone]

## 2023-08-12 ENCOUNTER — Other Ambulatory Visit: Payer: Self-pay

## 2023-08-12 DIAGNOSIS — D494 Neoplasm of unspecified behavior of bladder: Secondary | ICD-10-CM

## 2023-08-12 NOTE — Progress Notes (Signed)
Surgical Physician Order Form [facility]   Dr. Irineo Axon, MD  * Scheduling expectation : Next Available  *Length of Case: 60 min  *Clearance needed: no  *Anticoagulation Instructions: N/A  *Aspirin Instructions: N/A  *Post-op visit Date/Instructions:  1-2 week with pathology review  *Diagnosis: Bladder Tumor  *Procedure: Cystoscopy under anesthesia; TURBT versus bladder biopsy/fulguration   Additional orders: N/A  -Admit type: OUTpatient  -Anesthesia: Choice  -VTE Prophylaxis Standing Order SCD's       Other:   -Standing Lab Orders Per Anesthesia    Lab other: UA&Urine Culture-ordered 9/12  -Standing Test orders EKG/Chest x-ray per Anesthesia       Test other:   - Medications:  Ancef 2gm IV  -Other orders:  N/A

## 2023-08-14 ENCOUNTER — Other Ambulatory Visit: Payer: 59 | Admitting: Urology

## 2023-08-23 ENCOUNTER — Telehealth: Payer: Self-pay

## 2023-08-23 NOTE — Progress Notes (Signed)
   [facility] Surgical Posting Form  Surgery Date: Date: 09/24/2023  Surgeon: Dr. Irineo Axon, MD  Inpt ( No  )   Outpt (Yes)   Obs ( No  )   Diagnosis: D49.4 Bladder Tumor  -CPT: 52000, 46962, 95284, 13244  Surgery: Diagnostic Cystoscopy, Transurethral Resection of Bladder Tumor versus Bladder Biopsy with Fulguration   Stop Anticoagulations: N/A  Cardiac/Medical/Pulmonary Clearance needed: no  *Orders entered into EPIC  Date: 08/23/23   *Case booked in EPIC  Date: 08/21/2023  *Notified pt of Surgery: Date: 08/21/2023  PRE-OP UA & CX: yes, obtained in clinic on 08/08/2023  *Placed into Prior Authorization Work Angela Nevin Date: 08/23/23  Assistant/laser/rep:No

## 2023-08-23 NOTE — Telephone Encounter (Signed)
Per Dr. Lonna Cobb, Patient is to be scheduled for Diagnostic Cystoscopy, Transurethral Resection of Bladder Tumor versus Bladder Biopsy with Fulguration   Mr. Diodato was contacted and possible surgical dates were discussed, Tuesday October 29th, 2024 was agreed upon for surgery.   Patient was directed to call [phone] between 1-3pm the day before surgery to find out surgical arrival time.  Instructions were given not to eat or drink from midnight on the night before surgery and have a driver for the day of surgery. On the surgery day patient was instructed to enter through the [facility] entrance of [facility] report the Same Day Surgery desk.   Pre-Admit Testing will be in contact via phone to set up an interview with the anesthesia team to review your history and medications prior to surgery.   Reminder of this information was sent via MyChart to the patient.

## 2023-08-29 ENCOUNTER — Ambulatory Visit: Payer: 59 | Admitting: Primary Care

## 2023-08-29 ENCOUNTER — Other Ambulatory Visit: Payer: Self-pay | Admitting: Primary Care

## 2023-08-29 ENCOUNTER — Encounter: Payer: Self-pay | Admitting: Primary Care

## 2023-08-29 VITALS — BP 150/86 | HR 73 | Temp 97.8°F | Ht 74.0 in | Wt 272.0 lb

## 2023-08-29 DIAGNOSIS — R229 Localized swelling, mass and lump, unspecified: Secondary | ICD-10-CM | POA: Diagnosis not present

## 2023-08-29 DIAGNOSIS — I10 Essential (primary) hypertension: Secondary | ICD-10-CM | POA: Diagnosis not present

## 2023-08-29 MED ORDER — AMLODIPINE BESYLATE 5 MG PO TABS
5.0000 mg | ORAL_TABLET | Freq: Every day | ORAL | 0 refills | Status: DC
Start: 2023-08-29 — End: 2024-02-25

## 2023-08-29 NOTE — Addendum Note (Signed)
Addended by: Lonia Blood on: 08/29/2023 03:53 PM   Modules accepted: Orders

## 2023-08-29 NOTE — Progress Notes (Signed)
Subjective:    Patient ID: Cameron Mcneil, male    DOB: 06/08/1966, 57 y.o.   MRN: [id number]  Headache     DAMON GERSH is a very pleasant 57 y.o. male patient of Dr. Para March with a history of type 2 diabetes, hypertension, hyperlipidemia who presents today to discuss headaches and facial mass.  1) Hypertension: Currently managed on losartan 100 mg daily for hypertension.  Previously managed on losartan-hydrochlorothiazide and amlodipine, he is not sure why this was discontinued.  Over the last few weeks he's noticed left sided headaches with tenderness, feeling fatigued, feeling tired. Today he noticed a lump to the left jaw. He has been checking his BP at home which is running 150/94, 160s/101, 200/110.  He is compliant to his losartan 100 mg daily.  2) Facial Mass: Today his employees noticed a mass to the left side of his jaw.  Prior to today he had not noticed.  The mass is firm and nontender.  He has also noticed tenderness to the left occipital scalp.  He is a non-smoker, has never smoked previously.  Currently under evaluation for hematuria with potential papillary tumor.  Following with urology.  He denies dental pain, increased salivation, cervical adenopathy, cough, fevers, chills, erythema, warmth.  He has noticed some ear pressure to the left side.   BP Readings from Last 3 Encounters:  08/29/23 (!) 150/86  08/08/23 (!) 175/123  07/16/23 (!) 185/112        Review of Systems  Constitutional:  Positive for fatigue.  Eyes:  Negative for visual disturbance.  Respiratory:  Negative for shortness of breath.   Cardiovascular:  Negative for chest pain.  Skin:  Negative for color change.       Skin mass  Neurological:  Positive for headaches.         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 Single but in relationship. Work - Cytogeneticist, Pharmacist, hospital.   2 kids   Social Determinants of Health   Financial Resource Strain: Not on file  Food Insecurity: Not on file  Transportation Needs: Not on file  Physical Activity: Not on file  Stress: Not on file  Social Connections: Not on file  Intimate Partner Violence: Unknown (03/02/2022)   Received from [facility]   HITS    Physically Hurt: Not on file    Insult or Talk Down To: Not on file    Threaten Physical Harm: Not on file  Scream or Curse: Not on file    Past Surgical History:  Procedure Laterality Date   COLONOSCOPY  05/17/2021   poor prep- repeated on 06/09/21 Dr Christella Hartigan   ORIF ANKLE FRACTURE Right 11/12/2013   Procedure: OPEN REDUCTION INTERNAL FIXATION (ORIF) ANKLE FRACTURE/ irrigation and debridement of right forefoot wound  dehisence;  Surgeon: Toni Arthurs, MD;  Location: [facility] OR;  Service: Orthopedics;  Laterality: Right;   TOE SURGERY Right 11/06/2013   MPJ     HALLUX           DR  HYATT    Family History  Problem Relation Age of Onset   Diabetes Other    Prostate cancer Neg Hx    Colon cancer Neg Hx    Colon polyps Neg Hx    Esophageal cancer Neg Hx    Rectal cancer Neg Hx    Stomach cancer Neg Hx     No Known  Allergies  Current Outpatient Medications on File Prior to Visit  Medication Sig Dispense Refill   losartan (COZAAR) 100 MG tablet TAKE 0.5-1 TABLETS (50-100 MG TOTAL) BY MOUTH DAILY. 30 tablet 5   Multiple Vitamin (MULTIVITAMIN) tablet Take 1 tablet by mouth daily.     No current facility-administered medications on file prior to visit.    BP (!) 150/86   Pulse 73   Temp 97.8 F (36.6 C) (Temporal)   Ht 6\' 2"  (1.88 m)   Wt 272 lb (123.4 kg)   SpO2 96%   BMI 34.92 kg/m  Objective:   Physical Exam Constitutional:      General: He is not in acute distress. HENT:     Right Ear: Tympanic membrane and ear canal normal.     Left Ear: Tympanic membrane and ear canal normal.     Mouth/Throat:     Mouth: Mucous membranes are moist.     Pharynx: No posterior oropharyngeal erythema.  Neck:     Thyroid: No thyroid mass.      Comments: 4 cm rounded, firm, nontender, nonerythematous, immobile mass to left mandible near TMJ and parotid region. Cardiovascular:     Rate and Rhythm: Normal rate and regular rhythm.  Pulmonary:     Effort: Pulmonary effort is normal.     Breath sounds: Normal breath sounds.  Lymphadenopathy:     Cervical:     Right cervical: No superficial or posterior cervical adenopathy.    Left cervical: No superficial or posterior cervical adenopathy.  Skin:    General: Skin is warm and dry.  Neurological:     Mental Status: He is alert.           Assessment & Plan:  Localized skin mass, lump, or swelling Assessment & Plan: Suspicious characteristics.  Unclear etiology. Does not appear to be acute parotitis given lack of characteristic symptoms. Does not represent lymph node.  Stat CT soft tissue neck ordered and pending.  Orders: -     CT SOFT TISSUE NECK W CONTRAST; Future  Essential hypertension Assessment & Plan: Above goal today, also during multiple prior office visits and at home.  Continue losartan 100 mg daily. Add amlodipine 5 mg  daily.  Follow-up with PCP in 2 weeks.  Orders: -     amLODIPine Besylate; Take 1 tablet (5 mg total) by mouth daily. for blood pressure.  Dispense: 90 tablet; Refill: 0        Doreene Nest, NP

## 2023-08-29 NOTE — Assessment & Plan Note (Signed)
Above goal today, also during multiple prior office visits and at home.  Continue losartan 100 mg daily. Add amlodipine 5 mg daily.  Follow-up with PCP in 2 weeks.

## 2023-08-29 NOTE — Patient Instructions (Signed)
You will be contacted today regarding your CT scan.  Start amlodipine 5 mg once daily for blood pressure.  Continue taking losartan 100 mg daily for blood pressure.  Schedule a follow-up visit with Dr. Para March in 2 weeks for blood pressure check.  It was a pleasure meeting you!

## 2023-08-29 NOTE — Assessment & Plan Note (Signed)
Suspicious characteristics.  Unclear etiology. Does not appear to be acute parotitis given lack of characteristic symptoms. Does not represent lymph node.  Stat CT soft tissue neck ordered and pending.

## 2023-08-30 ENCOUNTER — Ambulatory Visit
Admission: RE | Admit: 2023-08-30 | Discharge: 2023-08-30 | Disposition: A | Discharge status: Home / Self Care | Payer: 59 | Source: Ambulatory Visit | Attending: Primary Care | Admitting: Primary Care

## 2023-08-30 ENCOUNTER — Other Ambulatory Visit: Payer: Self-pay | Admitting: Primary Care

## 2023-08-30 DIAGNOSIS — R229 Localized swelling, mass and lump, unspecified: Secondary | ICD-10-CM

## 2023-08-30 DIAGNOSIS — R221 Localized swelling, mass and lump, neck: Secondary | ICD-10-CM | POA: Diagnosis not present

## 2023-08-30 MED ORDER — IOPAMIDOL (ISOVUE-300) INJECTION 61%
500.0000 mL | Freq: Once | INTRAVENOUS | Status: AC | PRN
  Administered 2023-08-30: 75 mL via INTRAVENOUS

## 2023-09-09 DIAGNOSIS — D37030 Neoplasm of uncertain behavior of the parotid salivary glands: Secondary | ICD-10-CM | POA: Diagnosis not present

## 2023-09-10 ENCOUNTER — Other Ambulatory Visit: Payer: Self-pay | Admitting: Otolaryngology

## 2023-09-10 DIAGNOSIS — K118 Other diseases of salivary glands: Secondary | ICD-10-CM

## 2023-09-10 NOTE — Progress Notes (Signed)
Roanna Banning, MD sent to [facility] PROCEDURE / BIOPSY REVIEW Date: 09/10/23  Requested Biopsy site: L parotid Reason for request: mass Imaging review: Best seen on CT Neck, 08/30/23.  Decision: Approved Imaging modality to perform: Ultrasound Schedule with: No sedation / Local anesthetic Schedule for: Any VIR  Additional comments: @Schedulers . L Parotid Mass Bx. Local only.  Please contact me with questions, concerns, or if issue pertaining to this request arise.  Roanna Banning, MD [facility]

## 2023-09-13 NOTE — Progress Notes (Signed)
Patient for US guided Core LT Parotid Mass Biopsy on Mon 09/16/2023, Josh from [facility] called and spoke with the patient on the phone and gave pre-procedure instructions. Josh made the pt aware to be here at 12:30p and check in at the [facility].  Josh called 09/13/2023

## 2023-09-16 ENCOUNTER — Encounter
Admission: RE | Admit: 2023-09-16 | Discharge: 2023-09-16 | Disposition: A | Discharge status: Home / Self Care | Payer: 59 | Source: Ambulatory Visit | Attending: Urology | Admitting: Urology

## 2023-09-16 ENCOUNTER — Ambulatory Visit
Admission: RE | Admit: 2023-09-16 | Discharge: 2023-09-16 | Disposition: A | Discharge status: Home / Self Care | Payer: 59 | Source: Ambulatory Visit | Attending: Otolaryngology | Admitting: Otolaryngology

## 2023-09-16 ENCOUNTER — Other Ambulatory Visit: Payer: Self-pay

## 2023-09-16 DIAGNOSIS — R809 Proteinuria, unspecified: Secondary | ICD-10-CM

## 2023-09-16 DIAGNOSIS — D110 Benign neoplasm of parotid gland: Secondary | ICD-10-CM | POA: Diagnosis not present

## 2023-09-16 DIAGNOSIS — E785 Hyperlipidemia, unspecified: Secondary | ICD-10-CM

## 2023-09-16 DIAGNOSIS — K118 Other diseases of salivary glands: Secondary | ICD-10-CM

## 2023-09-16 DIAGNOSIS — I10 Essential (primary) hypertension: Secondary | ICD-10-CM

## 2023-09-16 MED ORDER — LIDOCAINE HCL (PF) 1 % IJ SOLN
4.0000 mL | Freq: Once | INTRAMUSCULAR | Status: AC
  Administered 2023-09-16: 4 mL via INTRADERMAL
  Filled 2023-09-16: qty 4

## 2023-09-16 NOTE — Procedures (Signed)
Interventional Radiology Procedure Note  Procedure: US guided biopsy of left parotid nodule  Complications: None  Estimated Blood Loss: None  Recommendations: - DC home   Signed,  Sterling Big, MD

## 2023-09-16 NOTE — Patient Instructions (Addendum)
Your procedure is scheduled on: Tuesday 09/24/23 To find out your arrival time, please call [phone] between 1PM - 3PM on: Monday 09/23/23   Report to the Registration Desk on the 1st floor of the [facility]. Free Valet parking is available.  If your arrival time is 6:00 am, do not arrive before that time as the [facility] entrance doors do not open until 6:00 am.  REMEMBER: Instructions that are not followed completely may result in serious medical risk, up to and including death; or upon the discretion of your surgeon and anesthesiologist your surgery may need to be rescheduled.  Do not eat food after midnight the night before surgery.  No gum chewing or hard candies.  You may however, drink CLEAR liquids up to 2 hours before you are scheduled to arrive for your surgery. Do not drink anything within 2 hours of your scheduled arrival time.  Clear liquids include: - water   One week prior to surgery: Stop Anti-inflammatories (NSAIDS) such as Advil, Aleve, Ibuprofen, Motrin, Naproxen, Naprosyn and Aspirin based products such as Excedrin, Goody's Powder, BC Powder. You may however, continue to take Tylenol if needed for pain up until the day of surgery.  Stop ANY OVER THE COUNTER supplements and vitamins until after surgery.  Continue taking all prescribed medications. Call your primary physician about the Amlodipine. Monitor your blood pressure.  TAKE ONLY THESE MEDICATIONS THE MORNING OF SURGERY WITH A SIP OF WATER:  none   No Alcohol for 24 hours before or after surgery.  No Smoking including e-cigarettes for 24 hours before surgery.  No chewable tobacco products for at least 6 hours before surgery.  No nicotine patches on the day of surgery.  Do not use any "recreational" drugs for at least a week (preferably 2 weeks) before your surgery.  Please be advised that the combination of cocaine and anesthesia may have negative outcomes, up to and including death. If you  test positive for cocaine, your surgery will be cancelled.  On the morning of surgery brush your teeth with toothpaste and water, you may rinse your mouth with mouthwash if you wish. Do not swallow any toothpaste or mouthwash.  Use CHG Soap or wipes as directed on instruction sheet. Shower with your regular soap.  Do not wear lotions, powders, or perfumes. You can use deodorant under arms only.  Do not shave body hair from the neck down 48 hours before surgery.  Wear comfortable clothing (specific to your surgery type) to the hospital.  Do not wear jewelry, make-up, hairpins, clips or nail polish.  For welded (permanent) jewelry: bracelets, anklets, waist bands, etc.  Please have this removed prior to surgery.  If it is not removed, there is a chance that hospital personnel will need to cut it off on the day of surgery. Contact lenses, hearing aids and dentures may not be worn into surgery.  Do not bring valuables to the hospital. [facility] is not responsible for any missing/lost belongings or valuables.   Notify your doctor if there is any change in your medical condition (cold, fever, infection).  If you are being discharged the day of surgery, you will not be allowed to drive home. You will need a responsible individual to drive you home and stay with you for 24 hours after surgery.   If you are taking public transportation, you will need to have a responsible individual with you.  If you are being admitted to the hospital overnight, leave your suitcase  in the car. After surgery it may be brought to your room.  In case of increased patient census, it may be necessary for you, the patient, to continue your postoperative care in the Same Day Surgery department.  After surgery, you can help prevent lung complications by doing breathing exercises.  Take deep breaths and cough every 1-2 hours. Your doctor may order a device called an Incentive Spirometer to help you take deep  breaths. When coughing or sneezing, hold a pillow firmly against your incision with both hands. This is called "splinting." Doing this helps protect your incision. It also decreases belly discomfort.  Surgery Visitation Policy:  Patients undergoing a surgery or procedure may have two family members or support persons with them as long as the person is not COVID-19 positive or experiencing its symptoms.   Inpatient Visitation:    Visiting hours are 7 a.m. to 8 p.m. Up to four visitors are allowed at one time in a patient room. The visitors may rotate out with other people during the day. One designated support person (adult) may remain overnight.  Please call the Pre-admissions Testing Dept. at [phone] if you have any questions about these instructions.

## 2023-09-17 ENCOUNTER — Encounter
Admission: RE | Admit: 2023-09-17 | Discharge: 2023-09-17 | Disposition: A | Discharge status: Home / Self Care | Payer: 59 | Source: Ambulatory Visit | Attending: Urology | Admitting: Urology

## 2023-09-17 ENCOUNTER — Encounter: Payer: Self-pay | Admitting: Urgent Care

## 2023-09-17 DIAGNOSIS — E1129 Type 2 diabetes mellitus with other diabetic kidney complication: Secondary | ICD-10-CM | POA: Insufficient documentation

## 2023-09-17 DIAGNOSIS — Z01810 Encounter for preprocedural cardiovascular examination: Secondary | ICD-10-CM | POA: Diagnosis not present

## 2023-09-17 DIAGNOSIS — R9431 Abnormal electrocardiogram [ECG] [EKG]: Secondary | ICD-10-CM | POA: Insufficient documentation

## 2023-09-17 DIAGNOSIS — Z01818 Encounter for other preprocedural examination: Secondary | ICD-10-CM | POA: Insufficient documentation

## 2023-09-17 DIAGNOSIS — I10 Essential (primary) hypertension: Secondary | ICD-10-CM | POA: Insufficient documentation

## 2023-09-17 DIAGNOSIS — Z01812 Encounter for preprocedural laboratory examination: Secondary | ICD-10-CM | POA: Diagnosis not present

## 2023-09-17 DIAGNOSIS — R809 Proteinuria, unspecified: Secondary | ICD-10-CM | POA: Diagnosis not present

## 2023-09-17 DIAGNOSIS — E785 Hyperlipidemia, unspecified: Secondary | ICD-10-CM | POA: Insufficient documentation

## 2023-09-17 HISTORY — DX: Type 2 diabetes mellitus without complications: E11.9

## 2023-09-17 HISTORY — DX: Patient's noncompliance with other medical treatment and regimen due to unspecified reason: Z91.199

## 2023-09-17 LAB — CBC
HCT: 47.8 % (ref 39.0–52.0)
Hemoglobin: 16 g/dL (ref 13.0–17.0)
MCH: 26.8 pg (ref 26.0–34.0)
MCHC: 33.5 g/dL (ref 30.0–36.0)
MCV: 79.9 fL — ABNORMAL LOW (ref 80.0–100.0)
Platelets: 243 10*3/uL (ref 150–400)
RBC: 5.98 MIL/uL — ABNORMAL HIGH (ref 4.22–5.81)
RDW: 12.7 % (ref 11.5–15.5)
WBC: 6.8 10*3/uL (ref 4.0–10.5)
nRBC: 0 % (ref 0.0–0.2)

## 2023-09-17 LAB — BASIC METABOLIC PANEL
Anion gap: 9 (ref 5–15)
BUN: 17 mg/dL (ref 6–20)
CO2: 23 mmol/L (ref 22–32)
Calcium: 9 mg/dL (ref 8.9–10.3)
Chloride: 104 mmol/L (ref 98–111)
Creatinine, Ser: 0.98 mg/dL (ref 0.61–1.24)
GFR, Estimated: >60 mL/min (ref >60)
Glucose, Bld: 118 mg/dL — ABNORMAL HIGH (ref 70–99)
Potassium: 3.5 mmol/L (ref 3.5–5.1)
Sodium: 136 mmol/L (ref 135–145)

## 2023-09-17 NOTE — Pre-Procedure Instructions (Signed)
Pt arrives today for labs. BP checked as pt has stopped taking his Amodipine r/t tingling sensations in his feet. He was encouraged at time of notification to contact PCP regarding Amlodipine s/e. He has ben noted to have elevated BP's previous. Today BP in rt arm 166/124 lt arm 146/109. Pt declines ED transport and states he will reach out to his PCP ASAP today. He did not take his Losartan prior to arriving today so also encourage to take as soon as he returns home.Marland Kitchen

## 2023-09-18 LAB — SURGICAL PATHOLOGY

## 2023-09-24 ENCOUNTER — Other Ambulatory Visit: Payer: Self-pay

## 2023-09-24 ENCOUNTER — Encounter
Admission: RE | Disposition: A | Discharge status: Home / Self Care | Payer: Self-pay | Source: Home / Self Care | Attending: Urology

## 2023-09-24 ENCOUNTER — Ambulatory Visit: Payer: 59 | Admitting: Anesthesiology

## 2023-09-24 ENCOUNTER — Encounter: Payer: Self-pay | Admitting: Urology

## 2023-09-24 ENCOUNTER — Ambulatory Visit
Admission: RE | Admit: 2023-09-24 | Discharge: 2023-09-24 | Disposition: A | Discharge status: Home / Self Care | Payer: 59 | Attending: Urology | Admitting: Urology

## 2023-09-24 DIAGNOSIS — C678 Malignant neoplasm of overlapping sites of bladder: Secondary | ICD-10-CM

## 2023-09-24 DIAGNOSIS — D494 Neoplasm of unspecified behavior of bladder: Secondary | ICD-10-CM

## 2023-09-24 DIAGNOSIS — C674 Malignant neoplasm of posterior wall of bladder: Secondary | ICD-10-CM | POA: Insufficient documentation

## 2023-09-24 DIAGNOSIS — I10 Essential (primary) hypertension: Secondary | ICD-10-CM | POA: Diagnosis not present

## 2023-09-24 DIAGNOSIS — E119 Type 2 diabetes mellitus without complications: Secondary | ICD-10-CM | POA: Diagnosis not present

## 2023-09-24 DIAGNOSIS — C673 Malignant neoplasm of anterior wall of bladder: Secondary | ICD-10-CM | POA: Diagnosis not present

## 2023-09-24 DIAGNOSIS — R310 Gross hematuria: Secondary | ICD-10-CM | POA: Diagnosis present

## 2023-09-24 HISTORY — PX: TRANSURETHRAL RESECTION OF BLADDER TUMOR: SHX2575

## 2023-09-24 HISTORY — PX: CYSTOSCOPY: SHX5120

## 2023-09-24 LAB — GLUCOSE, CAPILLARY: Glucose-Capillary: 118 mg/dL — ABNORMAL HIGH (ref 70–99)

## 2023-09-24 SURGERY — CYSTOSCOPY
Anesthesia: General

## 2023-09-24 MED ORDER — MIDAZOLAM HCL 2 MG/2ML IJ SOLN
INTRAMUSCULAR | Status: AC
  Filled 2023-09-24: qty 2

## 2023-09-24 MED ORDER — MIDAZOLAM HCL 2 MG/2ML IJ SOLN
INTRAMUSCULAR | Status: DC | PRN
  Administered 2023-09-24: 2 mg via INTRAVENOUS

## 2023-09-24 MED ORDER — FENTANYL CITRATE (PF) 100 MCG/2ML IJ SOLN: 25.0000 ug | INTRAMUSCULAR | Status: DC | PRN

## 2023-09-24 MED ORDER — SODIUM CHLORIDE 0.9 % IR SOLN
Status: DC | PRN
  Administered 2023-09-24: 6000 mL
  Administered 2023-09-24: 3000 mL
  Administered 2023-09-24: 6000 mL

## 2023-09-24 MED ORDER — FENTANYL CITRATE (PF) 100 MCG/2ML IJ SOLN
INTRAMUSCULAR | Status: AC
  Filled 2023-09-24: qty 2

## 2023-09-24 MED ORDER — OXYCODONE HCL 5 MG PO TABS
ORAL_TABLET | ORAL | Status: AC
  Filled 2023-09-24: qty 1

## 2023-09-24 MED ORDER — STERILE WATER FOR IRRIGATION IR SOLN
Status: DC | PRN
Start: 2023-09-24 — End: 2023-09-24
  Administered 2023-09-24: 3000 mL

## 2023-09-24 MED ORDER — ONDANSETRON HCL 4 MG/2ML IJ SOLN
4.0000 mg | Freq: Once | INTRAMUSCULAR | Status: DC | PRN
Start: 2023-09-24 — End: 2023-09-24

## 2023-09-24 MED ORDER — HYDROCODONE-ACETAMINOPHEN 5-325 MG PO TABS: 1.0000 | ORAL_TABLET | ORAL | 0 refills | Status: DC | PRN

## 2023-09-24 MED ORDER — ACETAMINOPHEN 10 MG/ML IV SOLN: 1000.0000 mg | Freq: Once | INTRAVENOUS | Status: DC | PRN

## 2023-09-24 MED ORDER — ONDANSETRON HCL 4 MG/2ML IJ SOLN
INTRAMUSCULAR | Status: DC | PRN
  Administered 2023-09-24: 4 mg via INTRAVENOUS

## 2023-09-24 MED ORDER — ORAL CARE MOUTH RINSE: 15.0000 mL | Freq: Once | OROMUCOSAL | Status: AC

## 2023-09-24 MED ORDER — OXYCODONE HCL 5 MG PO TABS
5.0000 mg | ORAL_TABLET | Freq: Once | ORAL | Status: AC | PRN
  Administered 2023-09-24: 5 mg via ORAL

## 2023-09-24 MED ORDER — ACETAMINOPHEN 10 MG/ML IV SOLN
INTRAVENOUS | Status: AC
  Filled 2023-09-24: qty 100

## 2023-09-24 MED ORDER — OXYBUTYNIN CHLORIDE 5 MG PO TABS: ORAL_TABLET | ORAL | 0 refills | Status: DC

## 2023-09-24 MED ORDER — CHLORHEXIDINE GLUCONATE 0.12 % MT SOLN
15.0000 mL | Freq: Once | OROMUCOSAL | Status: AC
  Administered 2023-09-24: 15 mL via OROMUCOSAL

## 2023-09-24 MED ORDER — CEFAZOLIN SODIUM-DEXTROSE 2-4 GM/100ML-% IV SOLN
2.0000 g | INTRAVENOUS | Status: AC
  Administered 2023-09-24: 3 g via INTRAVENOUS

## 2023-09-24 MED ORDER — CEFAZOLIN SODIUM-DEXTROSE 2-4 GM/100ML-% IV SOLN
INTRAVENOUS | Status: AC
  Filled 2023-09-24: qty 100

## 2023-09-24 MED ORDER — DEXMEDETOMIDINE HCL IN NACL 80 MCG/20ML IV SOLN
INTRAVENOUS | Status: AC
  Filled 2023-09-24: qty 20

## 2023-09-24 MED ORDER — DEXMEDETOMIDINE HCL IN NACL 80 MCG/20ML IV SOLN
INTRAVENOUS | Status: DC | PRN
  Administered 2023-09-24 (×2): 8 ug via INTRAVENOUS

## 2023-09-24 MED ORDER — EPHEDRINE 5 MG/ML INJ
INTRAVENOUS | Status: AC
  Filled 2023-09-24: qty 10

## 2023-09-24 MED ORDER — PROPOFOL 10 MG/ML IV BOLUS
INTRAVENOUS | Status: AC
  Filled 2023-09-24: qty 20

## 2023-09-24 MED ORDER — DEXAMETHASONE SODIUM PHOSPHATE 10 MG/ML IJ SOLN
INTRAMUSCULAR | Status: DC | PRN
  Administered 2023-09-24: 5 mg via INTRAVENOUS

## 2023-09-24 MED ORDER — LACTATED RINGERS IV SOLN: INTRAVENOUS | Status: DC

## 2023-09-24 MED ORDER — LIDOCAINE HCL (PF) 2 % IJ SOLN
INTRAMUSCULAR | Status: AC
Start: 2023-09-24 — End: ?
  Filled 2023-09-24: qty 5

## 2023-09-24 MED ORDER — ACETAMINOPHEN 10 MG/ML IV SOLN
INTRAVENOUS | Status: DC | PRN
  Administered 2023-09-24: 1000 mg via INTRAVENOUS

## 2023-09-24 MED ORDER — LIDOCAINE HCL (CARDIAC) PF 100 MG/5ML IV SOSY
PREFILLED_SYRINGE | INTRAVENOUS | Status: DC | PRN
  Administered 2023-09-24: 100 mg via INTRAVENOUS

## 2023-09-24 MED ORDER — PROPOFOL 10 MG/ML IV BOLUS
INTRAVENOUS | Status: DC | PRN
  Administered 2023-09-24: 100 mg via INTRAVENOUS
  Administered 2023-09-24: 200 mg via INTRAVENOUS

## 2023-09-24 MED ORDER — OXYCODONE HCL 5 MG/5ML PO SOLN: 5.0000 mg | Freq: Once | ORAL | Status: AC | PRN

## 2023-09-24 MED ORDER — EPHEDRINE SULFATE-NACL 50-0.9 MG/10ML-% IV SOSY
PREFILLED_SYRINGE | INTRAVENOUS | Status: DC | PRN
  Administered 2023-09-24: 10 mg via INTRAVENOUS
  Administered 2023-09-24 (×2): 7.5 mg via INTRAVENOUS
  Administered 2023-09-24 (×2): 10 mg via INTRAVENOUS

## 2023-09-24 MED ORDER — CEFAZOLIN SODIUM 1 G IJ SOLR
INTRAMUSCULAR | Status: AC
  Filled 2023-09-24: qty 10

## 2023-09-24 MED ORDER — FENTANYL CITRATE (PF) 100 MCG/2ML IJ SOLN
INTRAMUSCULAR | Status: DC | PRN
  Administered 2023-09-24: 25 ug via INTRAVENOUS
  Administered 2023-09-24: 100 ug via INTRAVENOUS
  Administered 2023-09-24 (×3): 25 ug via INTRAVENOUS

## 2023-09-24 MED ORDER — CHLORHEXIDINE GLUCONATE 0.12 % MT SOLN
OROMUCOSAL | Status: AC
  Filled 2023-09-24: qty 15

## 2023-09-24 SURGICAL SUPPLY — 39 items
BAG DRAIN SIEMENS DORNER NS (MISCELLANEOUS) ×1 IMPLANT
BAG DRN NS LF (MISCELLANEOUS) ×1
BAG DRN RND TRDRP ANRFLXCHMBR (UROLOGICAL SUPPLIES) ×1
BAG URINE DRAIN 2000ML AR STRL (UROLOGICAL SUPPLIES) ×1 IMPLANT
BRUSH SCRUB EZ 1% IODOPHOR (MISCELLANEOUS) ×1 IMPLANT
BRUSH SCRUB EZ 4% CHG (MISCELLANEOUS) IMPLANT
CATH FOL 2WAY LX 18X30 (CATHETERS) IMPLANT
CATH FOLEY 2WAY 18X30 (CATHETERS) IMPLANT
CATH FOLEY 2WAY SIL 18X30 (CATHETERS) IMPLANT
CATH URET FLEX-TIP 2 LUMEN 10F (CATHETERS) ×1 IMPLANT
DRAPE UTILITY 15X26 TOWEL STRL (DRAPES) ×1 IMPLANT
DRSG TELFA 3X4 N-ADH STERILE (GAUZE/BANDAGES/DRESSINGS) ×1 IMPLANT
ELECT LOOP 22F BIPOLAR SML (ELECTROSURGICAL)
ELECT REM PT RETURN 9FT ADLT (ELECTROSURGICAL) ×1
ELECTRODE LOOP 22F BIPOLAR SML (ELECTROSURGICAL) IMPLANT
ELECTRODE REM PT RTRN 9FT ADLT (ELECTROSURGICAL) ×1 IMPLANT
GLOVE BIOGEL PI IND STRL 7.5 (GLOVE) ×1 IMPLANT
GOWN STRL REUS W/ TWL LRG LVL3 (GOWN DISPOSABLE) ×2 IMPLANT
GOWN STRL REUS W/ TWL XL LVL3 (GOWN DISPOSABLE) ×1 IMPLANT
GOWN STRL REUS W/TWL LRG LVL3 (GOWN DISPOSABLE) ×2
GOWN STRL REUS W/TWL XL LVL3 (GOWN DISPOSABLE) ×1
GOWN STRL REUS W/TWL XL LVL4 (GOWN DISPOSABLE) ×1 IMPLANT
GUIDEWIRE STR DUAL SENSOR (WIRE) ×2 IMPLANT
HOLDER FOLEY CATH W/STRAP (MISCELLANEOUS) IMPLANT
IV NS IRRIG 3000ML ARTHROMATIC (IV SOLUTION) ×2 IMPLANT
KIT TURNOVER CYSTO (KITS) ×1 IMPLANT
LOOP CUT BIPOLAR 24F LRG (ELECTROSURGICAL) IMPLANT
NDL SAFETY ECLIPSE 18X1.5 (NEEDLE) ×1 IMPLANT
PACK CYSTO AR (MISCELLANEOUS) ×1 IMPLANT
SET CYSTO W/LG BORE CLAMP LF (SET/KITS/TRAYS/PACK) ×1 IMPLANT
SET IRRIG Y TYPE TUR BLADDER L (SET/KITS/TRAYS/PACK) ×1 IMPLANT
SHEATH NAVIGATOR HD 12/14X36 (SHEATH) ×1 IMPLANT
SURGILUBE 2OZ TUBE FLIPTOP (MISCELLANEOUS) ×1 IMPLANT
SYR 10ML LL (SYRINGE) ×1 IMPLANT
SYR TOOMEY IRRIG 70ML (MISCELLANEOUS) ×1
SYRINGE TOOMEY IRRIG 70ML (MISCELLANEOUS) ×1 IMPLANT
WATER STERILE IRR 1000ML POUR (IV SOLUTION) ×1 IMPLANT
WATER STERILE IRR 3000ML UROMA (IV SOLUTION) ×1 IMPLANT
WATER STERILE IRR 500ML POUR (IV SOLUTION) ×1 IMPLANT

## 2023-09-24 NOTE — Op Note (Signed)
Preoperative diagnosis: Gross hematuria  Postoperative diagnosis:  Multifocal bladder tumors  Procedure:  Cystoscopy Transurethral resection of bladder tumor (aggregate tumor measurement >5 cm)   Surgeon: Lorin Picket C. Merritt Kibby, M.D.  Anesthesia: General  Complications: None  Intraoperative findings:  Cystoscopy: Urethra normal in caliber without stricture; moderate lateral lobe enlargement with normal bladder neck The following tumors were identified: 2 nodular tumors left posterior wall measuring ~ 1.5 and 1 cm 1.5 cm nodular tumor left lateral wall 3 cm papillary tumor anterior to right hemitrigone extending to the bladder neck 1 cm nodular tumor upper mid posterior wall x 2 5 mm nodular lesion right prostatic urethra just proximal to the veru Area early papillary change left proximal prostatic urethra at bladder neck Papillary tumor emanating left UO  EBL: Minimal  Specimens: Posterior wall bladder tumors Right anterior bladder tumor Mid posterior wall bladder tumors Lesion right prostatic urethra Lesion left proximal prostatic urethra Base right anterior bladder tumor      Indication: Cameron Mcneil is a 57 y.o. male with a history of recurrent gross hematuria.  CT urogram showed no upper tract abnormalities.  Recent office cystoscopy with suboptimal visualization and possible bladder tumor.  Cystoscopy under anesthesia with possible TURBT was recommended.  After reviewing the management options for treatment, he elected to proceed with the above surgical procedure(s). We have discussed the potential benefits and risks of the procedure, side effects of the proposed treatment, the likelihood of the patient achieving the goals of the procedure, and any potential problems that might occur during the procedure or recuperation. Informed consent has been obtained.  Description of procedure: The patient was taken to the operating room and general anesthesia was induced.  The  patient was placed in the dorsal lithotomy position, prepped and draped in the usual sterile fashion, and preoperative antibiotics were administered. A preoperative time-out was performed.   A 21 French cystoscope was lubricated, inserted per urethra and advanced proximally under direct vision with findings as described above.  The cystoscope was removed and a 26 French continuous-flow resectoscope sheath with obturator was lubricated, placed per urethra and advanced into the bladder without difficulty.  An Iglesias resectoscope with loop was then placed in the sheath.  Repeat cystoscopy was performed with measurements obtained using the width of the loop as a guide.  Using bipolar current the posterior wall bladder tumors were resected down to superficial muscle.  The specimen was removed by irrigation.  Hemostasis was obtained with cautery.  The remaining tumors were resected in a similar fashion and sent separately based on location including the abnormalities noted in the prostatic urethra.  The left lateral wall tumor was unable to be resected due to a marked obturator reflex and was ablated with cautery and cold resection with the loop.  All sites were examined and hemostasis was noted to be adequate.  There was no evidence of bladder perforation.  The bladder was emptied and the resectoscope was removed.  An 80F Foley catheter was placed and irrigated with return of pink-tinged effluent.  The catheter was placed to gravity drainage.  After anesthetic reversal he was transported to the PACU in stable condition.   Plan: His girlfriend is a surgical oncology nurse and will remove his Foley catheter Thursday, 09/26/2023 It was elected not to evaluate tumor noted at the right ureteral orifice at this time as based on findings he may need restaging and ureteroscopy will be performed at that time. He will be contacted with  his pathology results   Laityn Bensen C. Lonna Cobb,  MD

## 2023-09-24 NOTE — Discharge Instructions (Addendum)
AMBULATORY SURGERY  DISCHARGE INSTRUCTIONS   The drugs that you were given will stay in your system until tomorrow so for the next 24 hours you should not:  Drive an automobile Make any legal decisions Drink any alcoholic beverage   You may resume regular meals tomorrow.  Today it is better to start with liquids and gradually work up to solid foods.  You may eat anything you prefer, but it is better to start with liquids, then soup and crackers, and gradually work up to solid foods.   Please notify your doctor immediately if you have any unusual bleeding, trouble breathing, redness and pain at the surgery site, drainage, fever, or pain not relieved by medication.    Additional Instructions:  Transurethral Resection of Bladder Tumor (TURBT) or Bladder Biopsy   Definition:  Transurethral Resection of the Bladder Tumor is a surgical procedure used to diagnose and remove tumors within the bladder.   General instructions:     Your recent bladder surgery requires very little post hospital care but some definite precautions.  Despite the fact that no skin incisions were used, the area around the bladder incisions are raw and covered with scabs to promote healing and prevent bleeding. Certain precautions are needed to insure that the scabs are not disturbed over the next 2-4 weeks while the healing proceeds.  Because the raw surface inside your bladder and the irritating effects of urine you may expect frequency of urination and/or urgency (a stronger desire to urinate) and perhaps even getting up at night more often. This will usually resolve or improve slowly over the healing period. You may see some blood in your urine over the first 6 weeks. Do not be alarmed, even if the urine was clear for a while. Get off your feet and drink lots of fluids until clearing occurs. If you start to pass clots or don't improve call us.  Diet:  You may return to your normal diet immediately. Because of  the raw surface of your bladder, alcohol, spicy foods, foods high in acid and drinks with caffeine may cause irritation or frequency and should be used in moderation. To keep your urine flowing freely and avoid constipation, drink plenty of fluids during the day (8-10 glasses). Tip: Avoid cranberry juice because it is very acidic.  Activity:  Your physical activity doesn't need to be restricted. However, if you are very active, you may see some blood in the urine. We suggest that you reduce your activity under the circumstances until the bleeding has stopped.  Bowels:  It is important to keep your bowels regular during the postoperative period. Straining with bowel movements can cause bleeding. A bowel movement every other day is reasonable. Use a mild laxative if needed, such as milk of magnesia 2-3 tablespoons, or 2 Dulcolax tablets. Call if you continue to have problems. If you had been taking narcotics for pain, before, during or after your surgery, you may be constipated. Take a laxative if necessary.    Medication:  You should resume your pre-surgery medications unless told not to. In addition you may be given an antibiotic to prevent or treat infection. Antibiotics are not always necessary. All medication should be taken as prescribed until the bottles are finished unless you are having an unusual reaction to one of the drugs.  Prescriptions for pain medication and medication for bladder irritation were sent to your pharmacy  May remove Foley catheter Thursday morning 09/26/2023   [facility] [address]  [address] [place], [place] [address] [phone]        Please contact your physician with any problems or Same Day Surgery at [phone], Monday through Friday 6 am to 4 pm, or [place] at [facility] number at [phone].

## 2023-09-24 NOTE — Transfer of Care (Signed)
Immediate Anesthesia Transfer of Care Note  Patient: Cameron Mcneil  Procedure(s) Performed: DIAGNOSTIC CYSTOSCOPY TRANSURETHRAL RESECTION OF BLADDER TUMOR (TURBT)  Patient Location: PACU  Anesthesia Type:General  Level of Consciousness: drowsy  Airway & Oxygen Therapy: Patient Spontanous Breathing and Patient connected to face mask oxygen  Post-op Assessment: Report given to RN and Post -op Vital signs reviewed and stable  Post vital signs: Reviewed and stable  Last Vitals:  Vitals Value Taken Time  BP 141/97 09/24/23 1106  Temp 96.89   Pulse 63 09/24/23 1109  Resp 15 09/24/23 1109  SpO2 100 % 09/24/23 1109  Vitals shown include unfiled device data.  Last Pain:  Vitals:   09/24/23 0750  PainSc: 0-No pain         Complications: No notable events documented.

## 2023-09-24 NOTE — Anesthesia Procedure Notes (Addendum)
Procedure Name: LMA Insertion Date/Time: 09/24/2023 9:42 AM  Performed by: Jasani Lengel, [place], CRNAPre-anesthesia Checklist: Patient identified, Patient being monitored, Timeout performed, Emergency Drugs available and Suction available Patient Re-evaluated:Patient Re-evaluated prior to induction Oxygen Delivery Method: Circle system utilized Preoxygenation: Pre-oxygenation with 100% oxygen Induction Type: IV induction Ventilation: Mask ventilation without difficulty LMA: LMA inserted LMA Size: 5.0 Tube type: Oral Number of attempts: 1 Placement Confirmation: positive ETCO2 and breath sounds checked- equal and bilateral Tube secured with: Tape Dental Injury: Teeth and Oropharynx as per pre-operative assessment

## 2023-09-24 NOTE — Anesthesia Preprocedure Evaluation (Signed)
Anesthesia Evaluation  Patient identified by MRN, date of birth, ID band Patient awake    Reviewed: Allergy & Precautions, NPO status , Patient's Chart, lab work & pertinent test results  History of Anesthesia Complications Negative for: history of anesthetic complications  Airway Mallampati: IV  TM Distance: >3 FB Neck ROM: Full    Dental no notable dental hx. (+) Teeth Intact   Pulmonary neg pulmonary ROS, neg sleep apnea, neg COPD, Patient abstained from smoking.Not current smoker Likely undiagnosed OSA   Pulmonary exam normal breath sounds clear to auscultation       Cardiovascular Exercise Tolerance: Good METShypertension, Pt. on medications (-) CAD and (-) Past MI (-) dysrhythmias  Rhythm:Regular Rate:Normal - Systolic murmurs    Neuro/Psych negative neurological ROS  negative psych ROS   GI/Hepatic ,neg GERD  ,,(+)     (-) substance abuse    Endo/Other  diabetes, Well Controlled    Renal/GU negative Renal ROS     Musculoskeletal   Abdominal  (+) + obese  Peds  Hematology   Anesthesia Other Findings Past Medical History: No date: Allergic rhinitis No date: ED (erectile dysfunction) No date: History of noncompliance with medical treatment No date: Hyperlipidemia No date: Hypertension No date: Hypogonadism male No date: T2DM (type 2 diabetes mellitus) (HCC)  Reproductive/Obstetrics                             Anesthesia Physical Anesthesia Plan  ASA: 2  Anesthesia Plan: General   Post-op Pain Management:    Induction: Intravenous  PONV Risk Score and Plan: 3 and Ondansetron, Dexamethasone and Midazolam  Airway Management Planned: LMA  Additional Equipment: None  Intra-op Plan:   Post-operative Plan: Extubation in OR  Informed Consent: I have reviewed the patients History and Physical, chart, labs and discussed the procedure including the risks, benefits and  alternatives for the proposed anesthesia with the patient or authorized representative who has indicated his/her understanding and acceptance.     Dental advisory given  Plan Discussed with: CRNA and Surgeon  Anesthesia Plan Comments: (Discussed risks of anesthesia with patient, including PONV, sore throat, lip/dental/eye damage. Rare risks discussed as well, such as cardiorespiratory and neurological sequelae, and allergic reactions. Discussed the role of CRNA in patient's perioperative care. Patient understands.)       Anesthesia Quick Evaluation

## 2023-09-24 NOTE — Anesthesia Postprocedure Evaluation (Signed)
Anesthesia Post Note  Patient: Cameron Mcneil  Procedure(s) Performed: DIAGNOSTIC CYSTOSCOPY TRANSURETHRAL RESECTION OF BLADDER TUMOR (TURBT)  Patient location during evaluation: PACU Anesthesia Type: General Level of consciousness: awake and alert Pain management: pain level controlled Vital Signs Assessment: post-procedure vital signs reviewed and stable Respiratory status: spontaneous breathing, nonlabored ventilation, respiratory function stable and patient connected to nasal cannula oxygen Cardiovascular status: blood pressure returned to baseline and stable Postop Assessment: no apparent nausea or vomiting Anesthetic complications: no   No notable events documented.   Last Vitals:  Vitals:   09/24/23 1154 09/24/23 1221  BP: (!) 146/100 (!) 147/100  Pulse: 74 68  Resp: 16 16  Temp: (!) 36.1 C (!) 36.2 C  SpO2: 92% 92%    Last Pain:  Vitals:   09/24/23 1221  TempSrc: Temporal  PainSc: 5                  Corinda Gubler

## 2023-09-24 NOTE — Interval H&P Note (Signed)
History and Physical Interval Note:  09/24/2023 9:19 AM  Cameron Mcneil  has presented today for surgery, with the diagnosis of Bladder Tumor.  The various methods of treatment have been discussed with the patient and family. After consideration of risks, benefits and other options for treatment, the patient has consented to  Procedure(s): DIAGNOSTIC CYSTOSCOPY (N/A) TRANSURETHRAL RESECTION OF BLADDER TUMOR (TURBT) (N/A) CYSTOSCOPY WITH BLADDER BIOPSY (N/A) CYSTOSCOPY WITH FULGERATION (N/A) as a surgical intervention.  The patient's history has been reviewed, patient examined, no change in status, stable for surgery.  I have reviewed the patient's chart and labs.  Questions were answered to the patient's satisfaction.     [name]

## 2023-09-24 NOTE — H&P (Signed)
Urology H&P   History of Present Illness: Cameron Mcneil is a 57 y.o. male with recurrent gross hematuria. CTU February 2024 showed no abnormalities. He no-showed for cystoscopy March 2024 and was sent a letter. He followed up for continued gross hematuria. Cystoscopy today showed suboptimal visualization however abnormal bladder mucosa with probable papillary tumor. Cystoscopy under anesthesia and TURBT versus biopsy recommended.  Urine cytology showed dysplastic cells.  Past Medical History:  Diagnosis Date   Allergic rhinitis    ED (erectile dysfunction)    History of noncompliance with medical treatment    Hyperlipidemia    Hypertension    Hypogonadism male    T2DM (type 2 diabetes mellitus) (HCC)     Past Surgical History:  Procedure Laterality Date   COLONOSCOPY  05/17/2021   poor prep- repeated on 06/09/21 Dr Christella Hartigan   ORIF ANKLE FRACTURE Right 11/12/2013   Procedure: OPEN REDUCTION INTERNAL FIXATION (ORIF) ANKLE FRACTURE/ irrigation and debridement of right forefoot wound  dehisence;  Surgeon: Toni Arthurs, MD;  Location: [facility] OR;  Service: Orthopedics;  Laterality: Right;   TOE SURGERY Right 11/06/2013   MPJ     HALLUX           DR  HYATT    Home Medications:  Current Meds  Medication Sig   losartan (COZAAR) 100 MG tablet TAKE 0.5-1 TABLETS (50-100 MG TOTAL) BY MOUTH DAILY.   Multiple Vitamin (MULTIVITAMIN) tablet Take 1 tablet by mouth daily.    Allergies: No Known Allergies  Family History  Problem Relation Age of Onset   Diabetes Other    Prostate cancer Neg Hx    Colon cancer Neg Hx    Colon polyps Neg Hx    Esophageal cancer Neg Hx    Rectal cancer Neg Hx    Stomach cancer Neg Hx     Social History:  reports that he has never smoked. He has never used smokeless tobacco. He reports current alcohol use. He reports that he does not use drugs.  ROS: A complete review of systems was performed.  All systems are negative except for pertinent findings as  noted.  Physical Exam:  Vital signs in last 24 hours: Temp:  [97.9 F (36.6 C)] 97.9 F (36.6 C) (10/29 0750) Pulse Rate:  [73] 73 (10/29 0750) Resp:  [18] 18 (10/29 0750) BP: (159)/(102) 159/102 (10/29 0750) SpO2:  [97 %] 97 % (10/29 0750) Weight:  [120.2 kg] 120.2 kg (10/29 0750) Constitutional:  Alert and oriented, No acute distress HEENT: [place] AT Cardiovascular: Regular rate and rhythm. Respiratory: Normal respiratory effort, lungs clear bilaterally Psychiatric: Normal mood and affect   Laboratory Data:  No results for input(s): "WBC", "HGB", "HCT" in the last 72 hours. No results for input(s): "NA", "K", "CL", "CO2", "GLUCOSE", "BUN", "CREATININE", "CALCIUM" in the last 72 hours. No results for input(s): "LABPT", "INR" in the last 72 hours. No results for input(s): "LABURIN" in the last 72 hours. Results for orders placed or performed in visit on 08/08/23  Microscopic Examination     Status: Abnormal   Collection Time: 08/08/23  1:20 PM   Urine  Result Value Ref Range Status   WBC, UA 0-5 0 - 5 /hpf Final   RBC, Urine >30 (A) 0 - 2 /hpf Final   Epithelial Cells (non renal) 0-10 0 - 10 /hpf Final   Bacteria, UA Few None seen/Few Final  CULTURE, URINE COMPREHENSIVE     Status: None   Collection Time: 08/08/23  2:10  PM   Specimen: Urine   UR  Result Value Ref Range Status   Urine Culture, Comprehensive Final report  Final   Organism ID, Bacteria Comment  Final    Comment: No growth in 36 - 48 hours.    Impression/Assessment:  Gross hematuria with abnormal cytology and possible bladder tumor on office cystoscopy  Plan:  Cystoscopy under anesthesia; possible TURBT/bladder biopsy.  The procedure has been discussed in detail.   09/24/2023, 9:16 AM  Irineo Axon,  MD

## 2023-09-25 ENCOUNTER — Encounter: Payer: Self-pay | Admitting: Urology

## 2023-09-25 LAB — SURGICAL PATHOLOGY

## 2023-10-01 ENCOUNTER — Telehealth: Payer: Self-pay | Admitting: Urology

## 2023-10-01 NOTE — Telephone Encounter (Signed)
Contacted Mr. Fuster to discuss bladder pathology report.  His mailbox was full and unable to leave a message.  He has a follow-up appointment 10/03/2023

## 2023-10-03 ENCOUNTER — Encounter: Payer: Self-pay | Admitting: Urology

## 2023-10-03 ENCOUNTER — Ambulatory Visit: Payer: 59 | Admitting: Urology

## 2023-10-03 VITALS — BP 175/115 | HR 68 | Ht 74.0 in | Wt 260.0 lb

## 2023-10-03 DIAGNOSIS — C679 Malignant neoplasm of bladder, unspecified: Secondary | ICD-10-CM

## 2023-10-03 MED ORDER — GEMTESA 75 MG PO TABS: 75.0000 mg | ORAL_TABLET | Freq: Every day | ORAL | Status: DC

## 2023-10-03 NOTE — Progress Notes (Signed)
Cameron Mcneil,acting as a scribe for Riki Altes, MD.,have documented all relevant documentation on the behalf of Riki Altes, MD,as directed by  Riki Altes, MD while in the presence of Riki Altes, MD.  10/03/2023 4:21 PM   Cameron Mcneil Aug 18, 1966 [id number]  Referring provider: Joaquim Nam, MD [address] [place],  [place] [address]  Chief Complaint  Patient presents with   Results    HPI: Cameron Mcneil is a 57 y.o. male who presents today for post-op follow up.   History of recurrent gross hematuria with office cystoscopy, equivocal for abnormal findings, secondary to sub-optimal visualization. Cystoscopy under anesthesia on 09/24/2023 with findings of multifocal nodular, high-grade appearing bladder tumors in multiple areas. He underwent TURBT. He was noted to have tumor which appeared to involve the right ureteral orifice. This was not addressed at initial resection. He is having storage-related voiding symptoms as to be expected, but they are improving. No fever or chills. He is having intermittent gross hematuria.  Pathology:  All resected tissue showed high-grade papillary urothelial carcinoma with extensive lamina propria invasion at all sites. Muscularis was present and not involved. He did undergo resection of abnormal tissue in the prostatic urethra, which returned urethral carcinoma. No prostatic ducts were identified.     PMH: Past Medical History:  Diagnosis Date   Allergic rhinitis    ED (erectile dysfunction)    History of noncompliance with medical treatment    Hyperlipidemia    Hypertension    Hypogonadism male    T2DM (type 2 diabetes mellitus) (HCC)     Surgical History: Past Surgical History:  Procedure Laterality Date   COLONOSCOPY  05/17/2021   poor prep- repeated on 06/09/21 Dr Christella Hartigan   CYSTOSCOPY N/A 09/24/2023   Procedure: DIAGNOSTIC CYSTOSCOPY;  Surgeon: Riki Altes, MD;  Location: [facility];  Service:  Urology;  Laterality: N/A;   ORIF ANKLE FRACTURE Right 11/12/2013   Procedure: OPEN REDUCTION INTERNAL FIXATION (ORIF) ANKLE FRACTURE/ irrigation and debridement of right forefoot wound  dehisence;  Surgeon: Toni Arthurs, MD;  Location: [facility];  Service: Orthopedics;  Laterality: Right;   TOE SURGERY Right 11/06/2013   [facility]     HALLUX           DR  HYATT   TRANSURETHRAL RESECTION OF BLADDER TUMOR N/A 09/24/2023   Procedure: TRANSURETHRAL RESECTION OF BLADDER TUMOR (TURBT);  Surgeon: Riki Altes, MD;  Location: [facility] ORS;  Service: Urology;  Laterality: N/A;    Home Medications:  Allergies as of 10/03/2023   No Known Allergies      Medication List        Accurate as of October 03, 2023  4:21 PM. If you have any questions, ask your nurse or doctor.          amLODipine 5 MG tablet Commonly known as: NORVASC Take 1 tablet (5 mg total) by mouth daily. for blood pressure.   Gemtesa 75 MG Tabs Generic drug: Vibegron Take 1 tablet (75 mg total) by mouth daily. Started by: Riki Altes   HYDROcodone-acetaminophen 5-325 MG tablet Commonly known as: NORCO/VICODIN Take 1 tablet by mouth every 4 (four) hours as needed for moderate pain (pain score 4-6).   losartan 100 MG tablet Commonly known as: COZAAR TAKE 0.5-1 TABLETS (50-100 MG TOTAL) BY MOUTH DAILY.   multivitamin tablet Take 1 tablet by mouth daily.   oxybutynin 5 MG tablet Commonly known as: DITROPAN 1  tab tid prn frequency,urgency, bladder spasm        Family History: Family History  Problem Relation Age of Onset   Diabetes Other    Prostate cancer Neg Hx    Colon cancer Neg Hx    Colon polyps Neg Hx    Esophageal cancer Neg Hx    Rectal cancer Neg Hx    Stomach cancer Neg Hx     Social History:  reports that he has never smoked. He has never used smokeless tobacco. He reports current alcohol use. He reports that he does not use drugs.   Physical Exam: BP (!) 175/115   Pulse 68   Ht 6\' 2"  (1.88 m)    Wt 260 lb (117.9 kg)   BMI 33.38 kg/m   Constitutional:  Alert and oriented, No acute distress. HEENT: [place] AT, moist mucus membranes.  Trachea midline, no masses. Neurologic: Grossly intact, no focal deficits, moving all 4 extremities. Psychiatric: Normal mood and affect.   Assessment & Plan:    1. Multifocal urothelial carcinoma of the bladder High-grade tumors with extensive lamina propria invasion We discussed approximately 30% of patients with T1 urothelial carcinoma will be upstaged to muscle invasive disease, and recommend restaging TUR  2. Possible urothelial carcinoma, right distal ureter Will plan a ureteroscopy at the time of restaging TUR with biopsy and possible laser ablation. We discussed he may need resection of the ureter orifice with possibility of stent placement Will obtain deeper prostatic urethral resection to assess for involvement of the prostatic ducts All questions were answered and he desires to proceed   I have reviewed the above documentation for accuracy and completeness, and I agree with the above.   Riki Altes, MD  [facility] [address], [address] [place], [place] [address] [phone]

## 2023-10-03 NOTE — H&P (View-Only) (Signed)
Marcelle Overlie Plume,acting as a scribe for Riki Altes, MD.,have documented all relevant documentation on the behalf of Riki Altes, MD,as directed by  Riki Altes, MD while in the presence of Riki Altes, MD.  10/03/2023 4:21 PM   Cameron Mcneil 1966/09/07 [id number]  Referring provider: Joaquim Nam, MD [address] [place],  [place] [address]  Chief Complaint  Patient presents with   Results    HPI: Cameron Mcneil is a 57 y.o. male who presents today for post-op follow up.   History of recurrent gross hematuria with office cystoscopy, equivocal for abnormal findings, secondary to sub-optimal visualization. Cystoscopy under anesthesia on 09/24/2023 with findings of multifocal nodular, high-grade appearing bladder tumors in multiple areas. He underwent TURBT. He was noted to have tumor which appeared to involve the right ureteral orifice. This was not addressed at initial resection. He is having storage-related voiding symptoms as to be expected, but they are improving. No fever or chills. He is having intermittent gross hematuria.  Pathology:  All resected tissue showed high-grade papillary urothelial carcinoma with extensive lamina propria invasion at all sites. Muscularis was present and not involved. He did undergo resection of abnormal tissue in the prostatic urethra, which returned urethral carcinoma. No prostatic ducts were identified.     PMH: Past Medical History:  Diagnosis Date   Allergic rhinitis    ED (erectile dysfunction)    History of noncompliance with medical treatment    Hyperlipidemia    Hypertension    Hypogonadism male    T2DM (type 2 diabetes mellitus) (HCC)     Surgical History: Past Surgical History:  Procedure Laterality Date   COLONOSCOPY  05/17/2021   poor prep- repeated on 06/09/21 Dr Christella Hartigan   CYSTOSCOPY N/A 09/24/2023   Procedure: DIAGNOSTIC CYSTOSCOPY;  Surgeon: Riki Altes, MD;  Location: [facility];  Service:  Urology;  Laterality: N/A;   ORIF ANKLE FRACTURE Right 11/12/2013   Procedure: OPEN REDUCTION INTERNAL FIXATION (ORIF) ANKLE FRACTURE/ irrigation and debridement of right forefoot wound  dehisence;  Surgeon: Toni Arthurs, MD;  Location: [facility];  Service: Orthopedics;  Laterality: Right;   TOE SURGERY Right 11/06/2013   [facility]     HALLUX           DR  HYATT   TRANSURETHRAL RESECTION OF BLADDER TUMOR N/A 09/24/2023   Procedure: TRANSURETHRAL RESECTION OF BLADDER TUMOR (TURBT);  Surgeon: Riki Altes, MD;  Location: [facility];  Service: Urology;  Laterality: N/A;    Home Medications:  Allergies as of 10/03/2023   No Known Allergies      Medication List        Accurate as of October 03, 2023  4:21 PM. If you have any questions, ask your nurse or doctor.          amLODipine 5 MG tablet Commonly known as: NORVASC Take 1 tablet (5 mg total) by mouth daily. for blood pressure.   Gemtesa 75 MG Tabs Generic drug: Vibegron Take 1 tablet (75 mg total) by mouth daily. Started by: Riki Altes   HYDROcodone-acetaminophen 5-325 MG tablet Commonly known as: NORCO/VICODIN Take 1 tablet by mouth every 4 (four) hours as needed for moderate pain (pain score 4-6).   losartan 100 MG tablet Commonly known as: COZAAR TAKE 0.5-1 TABLETS (50-100 MG TOTAL) BY MOUTH DAILY.   multivitamin tablet Take 1 tablet by mouth daily.   oxybutynin 5 MG tablet Commonly known as: DITROPAN 1  tab tid prn frequency,urgency, bladder spasm        Family History: Family History  Problem Relation Age of Onset   Diabetes Other    Prostate cancer Neg Hx    Colon cancer Neg Hx    Colon polyps Neg Hx    Esophageal cancer Neg Hx    Rectal cancer Neg Hx    Stomach cancer Neg Hx     Social History:  reports that he has never smoked. He has never used smokeless tobacco. He reports current alcohol use. He reports that he does not use drugs.   Physical Exam: BP (!) 175/115   Pulse 68   Ht 6\' 2"  (1.88 m)    Wt 260 lb (117.9 kg)   BMI 33.38 kg/m   Constitutional:  Alert and oriented, No acute distress. HEENT: [place] AT, moist mucus membranes.  Trachea midline, no masses. Neurologic: Grossly intact, no focal deficits, moving all 4 extremities. Psychiatric: Normal mood and affect.   Assessment & Plan:    1. Multifocal urothelial carcinoma of the bladder High-grade tumors with extensive lamina propria invasion We discussed approximately 30% of patients with T1 urothelial carcinoma will be upstaged to muscle invasive disease, and recommend restaging TUR  2. Possible urothelial carcinoma, right distal ureter Will plan a ureteroscopy at the time of restaging TUR with biopsy and possible laser ablation. We discussed he may need resection of the ureter orifice with possibility of stent placement Will obtain deeper prostatic urethral resection to assess for involvement of the prostatic ducts All questions were answered and he desires to proceed   I have reviewed the above documentation for accuracy and completeness, and I agree with the above.   Riki Altes, MD  [facility] [address] [place], [place] [address] [phone]

## 2023-10-04 ENCOUNTER — Telehealth: Payer: Self-pay

## 2023-10-04 ENCOUNTER — Other Ambulatory Visit: Payer: Self-pay

## 2023-10-04 DIAGNOSIS — C679 Malignant neoplasm of bladder, unspecified: Secondary | ICD-10-CM

## 2023-10-04 DIAGNOSIS — D4959 Neoplasm of unspecified behavior of other genitourinary organ: Secondary | ICD-10-CM

## 2023-10-04 NOTE — Progress Notes (Signed)
Surgical Physician Order Form [facility] [place]  Dr. Irineo Axon, MD  * Scheduling expectation : ~ 2 weeks  *Length of Case: 60 minutes  *Clearance needed: no  *Anticoagulation Instructions: N/A  *Aspirin Instructions: N/A  *Post-op visit Date/Instructions:  1-2 week with pathology review  *Diagnosis: Urothelial carcinoma bladder; possible urothelial carcinoma right ureter  *Procedure:  1.  Restaging TURBT 2.  Right ureteroscopy with ureteral biopsy; possible laser ablation right ureteral tumor   Additional orders: N/A  -Admit type: OUTpatient  -Anesthesia: General  -VTE Prophylaxis Standing Order SCD's       Other:   -Standing Lab Orders Per Anesthesia    Lab other: UA&Urine Culture  -Standing Test orders EKG/Chest x-ray per Anesthesia       Test other:   - Medications:  Ancef 2gm IV  -Other orders:  N/A

## 2023-10-04 NOTE — Telephone Encounter (Signed)
Per Dr. Lonna Cobb, Patient is to be scheduled for Restaging Transurethral Resection of Bladder Tumor, Right Ureteroscopy with Ureteral Biopsy and Possible Laser Ablation of Right Ureteral Tumor   Cameron Mcneil was contacted and possible surgical dates were discussed, Tuesday November 26th, 2024 was agreed upon for surgery.   Patient was instructed that Dr. Lonna Cobb will require them to provide a pre-op UA & CX prior to surgery. This was ordered and scheduled drop off appointment was made for 10/10/2023.    Patient was directed to call [phone] between 1-3pm the day before surgery to find out surgical arrival time.  Instructions were given not to eat or drink from midnight on the night before surgery and have a driver for the day of surgery. On the surgery day patient was instructed to enter through the Medical Mall entrance of [facility] report the Same Day Surgery desk.   Pre-Admit Testing will be in contact via phone to set up an interview with the anesthesia team to review your history and medications prior to surgery.   Reminder of this information was sent via MyChart to the patient.

## 2023-10-04 NOTE — Progress Notes (Signed)
   [facility] Surgical Posting Form  Surgery Date: Date: 10/22/2023  Surgeon: Dr. Irineo Axon, MD  Inpt ( No  )   Outpt (Yes)   Obs ( No  )   Diagnosis: C67.9 Urothelial Carcinoma of Bladder, D49.59 Ureteral Tumor  -CPT: 62703, 50093, 81829, 93716  Surgery: Restaging Transurethral Resection of Bladder Tumor, Right Ureteroscopy with Ureteral Biopsy and Possible Laser Ablation of Right Ureteral Tumor  Stop Anticoagulations: N/A  Cardiac/Medical/Pulmonary Clearance needed: no  *Orders entered into EPIC  Date: 10/04/23   *Case booked in [place]  Date: 10/04/23  *Notified pt of Surgery: Date: 10/04/23  PRE-OP UA & CX: yes, will obtain in clinic on 10/10/2023  *Placed into Prior Authorization Work Angela Nevin Date: 10/04/23  Assistant/laser/rep:No

## 2023-10-10 ENCOUNTER — Other Ambulatory Visit: Payer: 59

## 2023-10-15 ENCOUNTER — Encounter
Admission: RE | Admit: 2023-10-15 | Discharge: 2023-10-15 | Disposition: A | Discharge status: Home / Self Care | Payer: 59 | Source: Ambulatory Visit | Attending: Urology | Admitting: Urology

## 2023-10-15 ENCOUNTER — Other Ambulatory Visit: Payer: 59

## 2023-10-15 ENCOUNTER — Other Ambulatory Visit: Payer: Self-pay

## 2023-10-15 VITALS — BP 145/99 | Wt 269.2 lb

## 2023-10-15 DIAGNOSIS — R809 Proteinuria, unspecified: Secondary | ICD-10-CM

## 2023-10-15 DIAGNOSIS — C679 Malignant neoplasm of bladder, unspecified: Secondary | ICD-10-CM | POA: Diagnosis not present

## 2023-10-15 DIAGNOSIS — D4959 Neoplasm of unspecified behavior of other genitourinary organ: Secondary | ICD-10-CM | POA: Diagnosis not present

## 2023-10-15 HISTORY — DX: Malignant (primary) neoplasm, unspecified: C80.1

## 2023-10-15 HISTORY — DX: Personal history of urinary calculi: Z87.442

## 2023-10-15 LAB — URINALYSIS, COMPLETE
Bilirubin, UA: NEGATIVE
Glucose, UA: NEGATIVE
Ketones, UA: NEGATIVE
Nitrite, UA: NEGATIVE
Specific Gravity, UA: 1.025 (ref 1.005–1.030)
Urobilinogen, Ur: 0.2 mg/dL (ref 0.2–1.0)
pH, UA: 5.5 (ref 5.0–7.5)

## 2023-10-15 LAB — MICROSCOPIC EXAMINATION: RBC, Urine: >30 /[HPF] — AB (ref 0–2)

## 2023-10-15 NOTE — Patient Instructions (Addendum)
Your procedure is scheduled on: 10/22/23 - Tuesday Report to the Registration Desk on the 1st floor of the Medical Mall. To find out your arrival time, please call [phone] between 1PM - 3PM on: 10/21/23 - Monday If your arrival time is 6:00 am, do not arrive before that time as the Medical Mall entrance doors do not open until 6:00 am.  REMEMBER: Instructions that are not followed completely may result in serious medical risk, up to and including death; or upon the discretion of your surgeon and anesthesiologist your surgery may need to be rescheduled.  Do not eat food or drink any liquids after midnight the night before surgery.  No gum chewing or hard candies.  One week prior to surgery: Stop Anti-inflammatories (NSAIDS) such as Advil, Aleve, Ibuprofen, Motrin, Naproxen, Naprosyn and Aspirin based products such as Excedrin, Goody's Powder, BC Powder. You may take Tylenol if needed for pain up until the day of surgery.  Stop ANY OVER THE COUNTER supplements until after surgery : Multiple Vitamin   HOLD losartan (COZAAR) on the morning of your procedure.  ON THE DAY OF SURGERY ONLY TAKE THESE MEDICATIONS WITH SIPS OF WATER:  amLODipine (NORVASC)    No Alcohol for 24 hours before or after surgery.  No Smoking including e-cigarettes for 24 hours before surgery.  No chewable tobacco products for at least 6 hours before surgery.  No nicotine patches on the day of surgery.  Do not use any "recreational" drugs for at least a week (preferably 2 weeks) before your surgery.  Please be advised that the combination of cocaine and anesthesia may have negative outcomes, up to and including death. If you test positive for cocaine, your surgery will be cancelled.  On the morning of surgery brush your teeth with toothpaste and water, you may rinse your mouth with mouthwash if you wish. Do not swallow any toothpaste or mouthwash.  Do not wear jewelry, make-up, hairpins, clips or nail  polish.  For welded (permanent) jewelry: bracelets, anklets, waist bands, etc.  Please have this removed prior to surgery.  If it is not removed, there is a chance that hospital personnel will need to cut it off on the day of surgery.  Do not wear lotions, powders, or perfumes.   Do not shave body hair from the neck down 48 hours before surgery.  Contact lenses, hearing aids and dentures may not be worn into surgery.  Do not bring valuables to the hospital. [facility] is not responsible for any missing/lost belongings or valuables.    Notify your doctor if there is any change in your medical condition (cold, fever, infection).  Wear comfortable clothing (specific to your surgery type) to the hospital.  After surgery, you can help prevent lung complications by doing breathing exercises.  Take deep breaths and cough every 1-2 hours. Your doctor may order a device called an Incentive Spirometer to help you take deep breaths. When coughing or sneezing, hold a pillow firmly against your incision with both hands. This is called "splinting." Doing this helps protect your incision. It also decreases belly discomfort.  If you are being admitted to the hospital overnight, leave your suitcase in the car. After surgery it may be brought to your room.  In case of increased patient census, it may be necessary for you, the patient, to continue your postoperative care in the Same Day Surgery department.  If you are being discharged the day of surgery, you will not be allowed to drive  home. You will need a responsible individual to drive you home and stay with you for 24 hours after surgery.   If you are taking public transportation, you will need to have a responsible individual with you.  Please call the Pre-admissions Testing Dept. at [phone] if you have any questions about these instructions.  Surgery Visitation Policy:  Patients having surgery or a procedure may have two visitors.   Children under the age of 50 must have an adult with them who is not the patient.  Inpatient Visitation:    Visiting hours are 7 a.m. to 8 p.m. Up to four visitors are allowed at one time in a patient room. The visitors may rotate out with other people during the day.  One visitor age 99 or older may stay with the patient overnight and must be in the room by 8 p.m.

## 2023-10-16 ENCOUNTER — Telehealth: Payer: 59 | Admitting: Physician Assistant

## 2023-10-16 DIAGNOSIS — R310 Gross hematuria: Secondary | ICD-10-CM

## 2023-10-16 NOTE — Patient Instructions (Addendum)
Cameron Mcneil, thank you for joining Cameron Loveless, PA-C for today's virtual visit.  While this provider is not your primary care provider (PCP), if your PCP is located in our provider database this encounter information will be shared with them immediately following your visit.   A [facility] MyChart account gives you access to today's visit and all your visits, tests, and labs performed at [facility] " click here if you don't have a [facility] MyChart account or go to [email or web address]  Consent: (Patient) Cameron Mcneil provided verbal consent for this virtual visit at the beginning of the encounter.  Current Medications:  Current Outpatient Medications:    amLODipine (NORVASC) 5 MG tablet, Take 1 tablet (5 mg total) by mouth daily. for blood pressure., Disp: 90 tablet, Rfl: 0   HYDROcodone-acetaminophen (NORCO/VICODIN) 5-325 MG tablet, Take 1 tablet by mouth every 4 (four) hours as needed for moderate pain (pain score 4-6)., Disp: 10 tablet, Rfl: 0   losartan (COZAAR) 100 MG tablet, TAKE 0.5-1 TABLETS (50-100 MG TOTAL) BY MOUTH DAILY., Disp: 30 tablet, Rfl: 5   Multiple Vitamin (MULTIVITAMIN) tablet, Take 1 tablet by mouth daily., Disp: , Rfl:    oxybutynin (DITROPAN) 5 MG tablet, 1 tab tid prn frequency,urgency, bladder spasm, Disp: 15 tablet, Rfl: 0   Vibegron (GEMTESA) 75 MG TABS, Take 1 tablet (75 mg total) by mouth daily., Disp: 30 tablet, Rfl:    Medications ordered in this encounter:  No orders of the defined types were placed in this encounter.    *If you need refills on other medications prior to your next appointment, please contact your pharmacy*  Follow-Up: Call back or seek an in-person evaluation if the symptoms worsen or if the condition fails to improve as anticipated.   Virtual Care [phone]  Other Instructions Hematuria, Adult Hematuria is blood in the urine. Blood may be visible in the urine, or it may be  identified with a test. This condition can be caused by infections of the bladder, urethra, kidney, or prostate. Other possible causes include: Kidney stones. Cancer of the urinary tract. Too much calcium in the urine. Conditions that are passed from parent to child (inherited conditions). Exercise that requires a lot of energy. Infections can usually be treated with medicine, and a kidney stone usually will pass through your urine. If neither of these is the cause of your hematuria, more tests may be needed to identify the cause of your symptoms. It is very important to tell your health care provider about any blood in your urine, even if it is painless or the blood stops without treatment. Blood in the urine, when it happens and then stops and then happens again, can be a symptom of a very serious condition, including cancer. There is no pain in the initial stages of many urinary cancers. Follow these instructions at home: Medicines Take over-the-counter and prescription medicines only as told by your health care provider. If you were prescribed an antibiotic medicine, take it as told by your health care provider. Do not stop taking the antibiotic even if you start to feel better. Eating and drinking Drink enough fluid to keep your urine pale yellow. It is recommended that you drink 3-4 quarts (2.8-3.8 L) a day. If you have been diagnosed with an infection, drinking cranberry juice in addition to large amounts of water is recommended. Avoid caffeine, tea, and carbonated beverages. These tend to irritate the bladder. Avoid alcohol because it may irritate the  prostate (in males). General instructions If you have been diagnosed with a kidney stone, follow your health care provider's instructions about straining your urine to catch the stone. Empty your bladder often. Avoid holding urine for long periods of time. If you are male: After a bowel movement, wipe from front to back and use each piece  of toilet paper only once. Empty your bladder before and after sex. Pay attention to any changes in your symptoms. Tell your health care provider about any changes or any new symptoms. It is up to you to get the results of any tests. Ask your health care provider, or the department that is doing the test, when your results will be ready. Keep all follow-up visits. This is important. Contact a health care provider if: You develop back pain. You have a fever or chills. You have nausea or vomiting. Your symptoms do not improve after 3 days. Your symptoms get worse. Get help right away if: You develop severe vomiting and are unable to take medicine without vomiting. You develop severe pain in your back or abdomen even though you are taking medicine. You pass a large amount of blood in your urine. You pass blood clots in your urine. You feel very weak or like you might faint. You faint. Summary Hematuria is blood in the urine. It has many possible causes. It is very important that you tell your health care provider about any blood in your urine, even if it is painless or the blood stops without treatment. Take over-the-counter and prescription medicines only as told by your health care provider. Drink enough fluid to keep your urine pale yellow. This information is not intended to replace advice given to you by your health care provider. Make sure you discuss any questions you have with your health care provider. Document Revised: 07/13/2020 Document Reviewed: 07/13/2020 Elsevier Patient Education  2024 Elsevier Inc.   If you have been instructed to have an in-person evaluation today at a local Urgent Care facility, please use the link below. It will take you to a list of all of our available [facility], including address, phone number and hours of operation. Please do not delay care.  [facility]  If you or a family member do not have a primary care provider,  use the link below to schedule a visit and establish care. When you choose a [facility] primary care physician or advanced practice provider, you gain a long-term partner in health. Find a Primary Care Provider  Learn more about 's in-office and virtual care options:  - Get Care Now

## 2023-10-16 NOTE — Progress Notes (Signed)
Virtual Visit Consent   Rawlins Bookwalter [name], you are scheduled for a virtual visit with a [facility] provider today. Just as with appointments in the office, your consent must be obtained to participate. Your consent will be active for this visit and any virtual visit you may have with one of our providers in the next 365 days. If you have a MyChart account, a copy of this consent can be sent to you electronically.  As this is a virtual visit, video technology does not allow for your provider to perform a traditional examination. This may limit your provider's ability to fully assess your condition. If your provider identifies any concerns that need to be evaluated in person or the need to arrange testing (such as labs, EKG, etc.), we will make arrangements to do so. Although advances in technology are sophisticated, we cannot ensure that it will always work on either your end or our end. If the connection with a video visit is poor, the visit may have to be switched to a telephone visit. With either a video or telephone visit, we are not always able to ensure that we have a secure connection.  By engaging in this virtual visit, you consent to the provision of healthcare and authorize for your insurance to be billed (if applicable) for the services provided during this visit. Depending on your insurance coverage, you may receive a charge related to this service.  I need to obtain your verbal consent now. Are you willing to proceed with your visit today? Cameron Mcneil has provided verbal consent on 10/16/2023 for a virtual visit (video or telephone). [name], PA-C  Date: 10/16/2023 8:55 AM  Virtual Visit via Video Note   I, [name], connected with  Cameron Mcneil  ([id number], June 08, 1966) on 10/16/23 at  8:30 AM EST by a video-enabled telemedicine application and verified that I am speaking with the correct person using two identifiers.  Location: Patient: Virtual Visit  Location Patient: Home Provider: Virtual Visit Location Provider: Home Office   I discussed the limitations of evaluation and management by telemedicine and the availability of in person appointments. The patient expressed understanding and agreed to proceed.    History of Present Illness: [name] is a 57 y.o. who identifies as a male who was assigned male at birth, and is being seen today for passing blood clots with urination. Recently seen on 09/24/23 for a diagnostic cystoscopy and found to have a bladder tumor near the right ureteral orifice that had not been resected previously during prior TURBT. Had multiple lesions resected from previous, but one remained. Has TURBT scheduled for next Tuesday, 10/22/23.   Problems:  Patient Active Problem List   Diagnosis Date Noted   Localized skin mass, lump, or swelling 08/29/2023   Morbid obesity (HCC) 12/12/2022   Snoring 11/11/2022   Nontraumatic incomplete tear of right rotator cuff 04/18/2022   Hematuria 03/22/2022   Routine general medical examination at a health care facility 06/24/2019   Diabetes mellitus with microalbuminuria (HCC) 06/24/2019   Essential hypertension 01/22/2017   HLD (hyperlipidemia) 08/24/2015   Advance care planning 08/24/2015   Hypogonadism in male 01/10/2009   ERECTILE DYSFUNCTION 12/30/2008    Allergies: No Known Allergies Medications:  Current Outpatient Medications:    amLODipine (NORVASC) 5 MG tablet, Take 1 tablet (5 mg total) by mouth daily. for blood pressure., Disp: 90 tablet, Rfl: 0   HYDROcodone-acetaminophen (NORCO/VICODIN) 5-325 MG tablet, Take 1 tablet by mouth  every 4 (four) hours as needed for moderate pain (pain score 4-6)., Disp: 10 tablet, Rfl: 0   losartan (COZAAR) 100 MG tablet, TAKE 0.5-1 TABLETS (50-100 MG TOTAL) BY MOUTH DAILY., Disp: 30 tablet, Rfl: 5   Multiple Vitamin (MULTIVITAMIN) tablet, Take 1 tablet by mouth daily., Disp: , Rfl:    oxybutynin (DITROPAN) 5 MG tablet, 1 tab  tid prn frequency,urgency, bladder spasm, Disp: 15 tablet, Rfl: 0   Vibegron (GEMTESA) 75 MG TABS, Take 1 tablet (75 mg total) by mouth daily., Disp: 30 tablet, Rfl:   Observations/Objective: Patient is well-developed, well-nourished in no acute distress.  Resting comfortably at home.  Head is normocephalic, atraumatic.  No labored breathing.  Speech is clear and coherent with logical content.  Patient is alert and oriented at baseline.    Assessment and Plan: 1. Gross hematuria  - Push fluids - Call Dr. Heywood Footman office   Follow Up Instructions: I discussed the assessment and treatment plan with the patient. The patient was provided an opportunity to ask questions and all were answered. The patient agreed with the plan and demonstrated an understanding of the instructions.  A copy of instructions were sent to the patient via MyChart unless otherwise noted below.    The patient was advised to call back or seek an in-person evaluation if the symptoms worsen or if the condition fails to improve as anticipated.    [name], PA-C

## 2023-10-18 LAB — CULTURE, URINE COMPREHENSIVE

## 2023-10-22 ENCOUNTER — Ambulatory Visit
Admission: RE | Admit: 2023-10-22 | Discharge: 2023-10-22 | Disposition: A | Discharge status: Home / Self Care | Payer: 59 | Source: Ambulatory Visit | Attending: Urology | Admitting: Urology

## 2023-10-22 ENCOUNTER — Other Ambulatory Visit: Payer: Self-pay | Admitting: Urology

## 2023-10-22 ENCOUNTER — Encounter
Admission: RE | Disposition: A | Discharge status: Home / Self Care | Payer: Self-pay | Source: Ambulatory Visit | Attending: Urology

## 2023-10-22 ENCOUNTER — Ambulatory Visit: Payer: 59 | Admitting: Certified Registered"

## 2023-10-22 ENCOUNTER — Ambulatory Visit: Payer: 59

## 2023-10-22 ENCOUNTER — Other Ambulatory Visit: Payer: Self-pay

## 2023-10-22 ENCOUNTER — Encounter: Payer: Self-pay | Admitting: Urology

## 2023-10-22 DIAGNOSIS — E785 Hyperlipidemia, unspecified: Secondary | ICD-10-CM | POA: Insufficient documentation

## 2023-10-22 DIAGNOSIS — C674 Malignant neoplasm of posterior wall of bladder: Secondary | ICD-10-CM | POA: Insufficient documentation

## 2023-10-22 DIAGNOSIS — Z833 Family history of diabetes mellitus: Secondary | ICD-10-CM | POA: Insufficient documentation

## 2023-10-22 DIAGNOSIS — E669 Obesity, unspecified: Secondary | ICD-10-CM | POA: Insufficient documentation

## 2023-10-22 DIAGNOSIS — I10 Essential (primary) hypertension: Secondary | ICD-10-CM | POA: Diagnosis not present

## 2023-10-22 DIAGNOSIS — C675 Malignant neoplasm of bladder neck: Secondary | ICD-10-CM | POA: Diagnosis not present

## 2023-10-22 DIAGNOSIS — D4959 Neoplasm of unspecified behavior of other genitourinary organ: Secondary | ICD-10-CM

## 2023-10-22 DIAGNOSIS — E119 Type 2 diabetes mellitus without complications: Secondary | ICD-10-CM | POA: Diagnosis not present

## 2023-10-22 DIAGNOSIS — C61 Malignant neoplasm of prostate: Secondary | ICD-10-CM | POA: Diagnosis not present

## 2023-10-22 DIAGNOSIS — C672 Malignant neoplasm of lateral wall of bladder: Secondary | ICD-10-CM | POA: Insufficient documentation

## 2023-10-22 DIAGNOSIS — R310 Gross hematuria: Secondary | ICD-10-CM | POA: Diagnosis not present

## 2023-10-22 DIAGNOSIS — E1129 Type 2 diabetes mellitus with other diabetic kidney complication: Secondary | ICD-10-CM

## 2023-10-22 DIAGNOSIS — N201 Calculus of ureter: Secondary | ICD-10-CM | POA: Diagnosis not present

## 2023-10-22 DIAGNOSIS — Z6834 Body mass index (BMI) 34.0-34.9, adult: Secondary | ICD-10-CM | POA: Insufficient documentation

## 2023-10-22 DIAGNOSIS — C679 Malignant neoplasm of bladder, unspecified: Secondary | ICD-10-CM | POA: Diagnosis not present

## 2023-10-22 HISTORY — PX: URETEROSCOPY: SHX842

## 2023-10-22 HISTORY — PX: TRANSURETHRAL RESECTION OF BLADDER TUMOR: SHX2575

## 2023-10-22 LAB — GLUCOSE, CAPILLARY
Glucose-Capillary: 143 mg/dL — ABNORMAL HIGH (ref 70–99)
Glucose-Capillary: 115 mg/dL — ABNORMAL HIGH (ref 70–99)

## 2023-10-22 SURGERY — TURBT (TRANSURETHRAL RESECTION OF BLADDER TUMOR)
Anesthesia: General | Site: Ureter | Laterality: Right

## 2023-10-22 MED ORDER — ORAL CARE MOUTH RINSE: 15.0000 mL | Freq: Once | OROMUCOSAL | Status: AC

## 2023-10-22 MED ORDER — LIDOCAINE HCL (CARDIAC) PF 100 MG/5ML IV SOSY
PREFILLED_SYRINGE | INTRAVENOUS | Status: DC | PRN
  Administered 2023-10-22: 100 mg via INTRAVENOUS

## 2023-10-22 MED ORDER — ONDANSETRON HCL 4 MG/2ML IJ SOLN
INTRAMUSCULAR | Status: DC | PRN
  Administered 2023-10-22: 4 mg via INTRAVENOUS

## 2023-10-22 MED ORDER — MIDAZOLAM HCL 2 MG/2ML IJ SOLN
INTRAMUSCULAR | Status: AC
Start: 2023-10-22 — End: ?
  Filled 2023-10-22: qty 2

## 2023-10-22 MED ORDER — DEXAMETHASONE SODIUM PHOSPHATE 10 MG/ML IJ SOLN
INTRAMUSCULAR | Status: DC | PRN
  Administered 2023-10-22: 10 mg via INTRAVENOUS

## 2023-10-22 MED ORDER — PROPOFOL 10 MG/ML IV BOLUS
INTRAVENOUS | Status: AC
  Filled 2023-10-22: qty 20

## 2023-10-22 MED ORDER — HYDROCODONE-ACETAMINOPHEN 5-325 MG PO TABS: 1.0000 | ORAL_TABLET | ORAL | 0 refills | Status: DC | PRN

## 2023-10-22 MED ORDER — ROCURONIUM BROMIDE 100 MG/10ML IV SOLN
INTRAVENOUS | Status: DC | PRN
  Administered 2023-10-22: 20 mg via INTRAVENOUS
  Administered 2023-10-22 (×2): 10 mg via INTRAVENOUS
  Administered 2023-10-22: 50 mg via INTRAVENOUS

## 2023-10-22 MED ORDER — PHENYLEPHRINE 80 MCG/ML (10ML) SYRINGE FOR IV PUSH (FOR BLOOD PRESSURE SUPPORT)
PREFILLED_SYRINGE | INTRAVENOUS | Status: DC | PRN
  Administered 2023-10-22: 80 ug via INTRAVENOUS

## 2023-10-22 MED ORDER — OXYCODONE HCL 5 MG PO TABS: 5.0000 mg | ORAL_TABLET | Freq: Once | ORAL | Status: DC | PRN

## 2023-10-22 MED ORDER — FENTANYL CITRATE (PF) 100 MCG/2ML IJ SOLN
INTRAMUSCULAR | Status: DC | PRN
  Administered 2023-10-22 (×3): 50 ug via INTRAVENOUS

## 2023-10-22 MED ORDER — FENTANYL CITRATE (PF) 100 MCG/2ML IJ SOLN
INTRAMUSCULAR | Status: AC
  Filled 2023-10-22: qty 2

## 2023-10-22 MED ORDER — DEXMEDETOMIDINE HCL IN NACL 80 MCG/20ML IV SOLN
INTRAVENOUS | Status: DC | PRN
  Administered 2023-10-22: 12 ug via INTRAVENOUS
  Administered 2023-10-22: 8 ug via INTRAVENOUS

## 2023-10-22 MED ORDER — OXYCODONE HCL 5 MG/5ML PO SOLN: 5.0000 mg | Freq: Once | ORAL | Status: DC | PRN

## 2023-10-22 MED ORDER — HYDROMORPHONE HCL 1 MG/ML IJ SOLN: 0.2500 mg | INTRAMUSCULAR | Status: DC | PRN

## 2023-10-22 MED ORDER — CHLORHEXIDINE GLUCONATE 0.12 % MT SOLN
15.0000 mL | Freq: Once | OROMUCOSAL | Status: AC
  Administered 2023-10-22: 15 mL via OROMUCOSAL

## 2023-10-22 MED ORDER — SODIUM CHLORIDE 0.9 % IR SOLN
Status: DC | PRN
  Administered 2023-10-22: 3000 mL
  Administered 2023-10-22 (×2): 6000 mL
  Administered 2023-10-22: 3000 mL
  Administered 2023-10-22: 6000 mL

## 2023-10-22 MED ORDER — KETOROLAC TROMETHAMINE 30 MG/ML IJ SOLN
INTRAMUSCULAR | Status: DC | PRN
  Administered 2023-10-22: 30 mg via INTRAVENOUS

## 2023-10-22 MED ORDER — CHLORHEXIDINE GLUCONATE 0.12 % MT SOLN
OROMUCOSAL | Status: AC
  Filled 2023-10-22: qty 15

## 2023-10-22 MED ORDER — ACETAMINOPHEN 10 MG/ML IV SOLN
INTRAVENOUS | Status: DC | PRN
  Administered 2023-10-22: 1000 mg via INTRAVENOUS

## 2023-10-22 MED ORDER — PROPOFOL 10 MG/ML IV BOLUS
INTRAVENOUS | Status: DC | PRN
  Administered 2023-10-22: 200 mg via INTRAVENOUS

## 2023-10-22 MED ORDER — CEFAZOLIN SODIUM-DEXTROSE 2-4 GM/100ML-% IV SOLN
INTRAVENOUS | Status: AC
  Filled 2023-10-22: qty 100

## 2023-10-22 MED ORDER — SUGAMMADEX SODIUM 200 MG/2ML IV SOLN
INTRAVENOUS | Status: DC | PRN
  Administered 2023-10-22: 200 mg via INTRAVENOUS

## 2023-10-22 MED ORDER — OXYBUTYNIN CHLORIDE 5 MG PO TABS: ORAL_TABLET | ORAL | 1 refills | Status: DC

## 2023-10-22 MED ORDER — CEFAZOLIN SODIUM-DEXTROSE 2-4 GM/100ML-% IV SOLN
2.0000 g | INTRAVENOUS | Status: AC
Start: 2023-10-22 — End: 2023-10-22
  Administered 2023-10-22: 3 g via INTRAVENOUS

## 2023-10-22 MED ORDER — SODIUM CHLORIDE 0.9 % IV SOLN: INTRAVENOUS | Status: DC

## 2023-10-22 MED ORDER — IOHEXOL 180 MG/ML  SOLN
INTRAMUSCULAR | Status: DC | PRN
  Administered 2023-10-22: 10 mL

## 2023-10-22 MED ORDER — MIDAZOLAM HCL 2 MG/2ML IJ SOLN
INTRAMUSCULAR | Status: DC | PRN
  Administered 2023-10-22: 2 mg via INTRAVENOUS

## 2023-10-22 SURGICAL SUPPLY — 36 items
BAG DRAIN SIEMENS DORNER NS (MISCELLANEOUS) ×3 IMPLANT
BAG PRESSURE INF REUSE 3000 (BAG) ×3 IMPLANT
BAG URINE DRAIN 2000ML AR STRL (UROLOGICAL SUPPLIES) ×3 IMPLANT
BRUSH SCRUB EZ 1% IODOPHOR (MISCELLANEOUS) ×2 IMPLANT
BRUSH SCRUB EZ 4% CHG (MISCELLANEOUS) ×1 IMPLANT
CATH FOL 2WAY LX 18X30 (CATHETERS) ×1 IMPLANT
CATH FOLEY 2WAY 18X30 (CATHETERS) IMPLANT
CATH FOLEY 2WAY SIL 18X30 (CATHETERS)
CATH URET FLEX-TIP 2 LUMEN 10F (CATHETERS) ×2 IMPLANT
CATH URETL OPEN 5X70 (CATHETERS) ×2 IMPLANT
CNTNR URN SCR LID CUP LEK RST (MISCELLANEOUS) ×2 IMPLANT
DRAPE UTILITY 15X26 TOWEL STRL (DRAPES) ×3 IMPLANT
DRSG TELFA 3X4 N-ADH STERILE (GAUZE/BANDAGES/DRESSINGS) ×4 IMPLANT
ELECT LOOP 22F BIPOLAR SML (ELECTROSURGICAL) ×3
ELECT REM PT RETURN 9FT ADLT (ELECTROSURGICAL)
ELECTRODE LOOP 22F BIPOLAR SML (ELECTROSURGICAL) ×1 IMPLANT
ELECTRODE REM PT RTRN 9FT ADLT (ELECTROSURGICAL) IMPLANT
FORCEPS BIOP PIRANHA Y (CUTTING FORCEPS) IMPLANT
GLOVE BIOGEL PI IND STRL 7.5 (GLOVE) ×3 IMPLANT
GOWN STRL REUS W/ TWL LRG LVL3 (GOWN DISPOSABLE) ×6 IMPLANT
GOWN STRL REUS W/ TWL XL LVL3 (GOWN DISPOSABLE) ×3 IMPLANT
GOWN STRL REUS W/TWL XL LVL4 (GOWN DISPOSABLE) ×3 IMPLANT
GUIDEWIRE GREEN .038 145CM (MISCELLANEOUS) ×2 IMPLANT
GUIDEWIRE STR DUAL SENSOR (WIRE) ×3 IMPLANT
IV NS IRRIG 3000ML ARTHROMATIC (IV SOLUTION) ×12 IMPLANT
KIT TURNOVER CYSTO (KITS) ×3 IMPLANT
LOOP CUT BIPOLAR 24F LRG (ELECTROSURGICAL) IMPLANT
NDL SAFETY ECLIPSE 18X1.5 (NEEDLE) ×3 IMPLANT
PACK CYSTO AR (MISCELLANEOUS) ×3 IMPLANT
SET CYSTO W/LG BORE CLAMP LF (SET/KITS/TRAYS/PACK) ×2 IMPLANT
SET IRRIG Y TYPE TUR BLADDER L (SET/KITS/TRAYS/PACK) ×3 IMPLANT
SHEATH NAVIGATOR HD 12/14X36 (SHEATH) ×3 IMPLANT
SURGILUBE 2OZ TUBE FLIPTOP (MISCELLANEOUS) ×3 IMPLANT
SYR TOOMEY IRRIG 70ML (MISCELLANEOUS) ×3
SYRINGE TOOMEY IRRIG 70ML (MISCELLANEOUS) ×3 IMPLANT
WATER STERILE IRR 500ML POUR (IV SOLUTION) ×3 IMPLANT

## 2023-10-22 NOTE — Op Note (Signed)
Preoperative diagnosis:  Urothelial carcinoma bladder Rule out right ureteral tumor  Postoperative diagnosis:  Urothelial carcinoma bladder  Procedure: Transurethral resection bladder tumor (2-5 cm) Bladder biopsies with fulguration Transurethral prostatic urethral biopsies with fulguration Right ureteroscopy  Surgeon: Riki Altes, MD  Anesthesia: General  Complications: None  Intraoperative findings:  Cystoscopy: Tight distal urethra; moderate lateral lobe enlargement with normal bladder neck.  Prior resection sites with fibrinous exudate; small 5 mm right posterior wall tumor identified; papillary tumor right bladder base measuring ~ 1 cm; papillary tumor left inferior lateral wall measuring just over 2 cm; abnormal appearing tissue left bladder neck without definite tumor Right ureteroscopy: Ureteral mucosa normal in appearance without papillary tumor or erythema   EBL: Minimal  Specimens:  Tumor right posterior bladder wall Tumor right bladder base Tumor left inferolateral wall Biopsy base of specimen #3 Biopsies base of previously resected bladder tumor sites Prostatic urethral biopsies Abnormal tissue left bladder neck  Indication: Cameron Mcneil is a 57 y.o. patient with multifocal urothelial carcinoma bladder high-grade, T1.  He was noted to have possible right ureteral tumor at time of initial TURBT and presents for restaging bladder biopsies, cystoscopy under anesthesia and right ureteroscopy with possible biopsy.  After reviewing the management options for treatment, he elected to proceed with the above surgical procedure(s). We have discussed the potential benefits and risks of the procedure, side effects of the proposed treatment, the likelihood of the patient achieving the goals of the procedure, and any potential problems that might occur during the procedure or recuperation. Informed consent has been obtained.  Description of procedure:  The patient was  taken to the operating room and general anesthesia was induced.  The patient was placed in the dorsal lithotomy position, prepped and draped in the usual sterile fashion, and preoperative antibiotics were administered. A preoperative time-out was performed.   The distal urethra would not except a 26 French continuous-flow resectoscope sheath with obturator and was dilated with [employer] sounds from 20-30 F.  The resectoscope sheath was then easily advanced in the bladder without difficulty.  An Iglesias resectoscope with loop was then placed in the sheath and panendoscopy was performed with findings as described above.  Ureteral orifices were identified.  No papillary tumor was identified at the right ureteral orifice.  The above papillary tumors were then resected down to superficial muscle using bipolar current.  Specimen was removed via irrigation and sent separately.  Cold biopsies of the base of the left inferolateral tumor were obtained.  All resection sites were then cauterized.  Superficial resection of the fibrinous exudate of previous biopsy sites was then performed and cold biopsies of the sites were then performed and sent in 1 specimen container.  Biopsy sites were fulgurated with bipolar current.  Prostatic urethral biopsies were then performed with the loop on the right and left lateral lobes taking deeper resection into the prostatic tissue.  The sites were then cauterized.  The abnormal appearing tissue at the left bladder neck was then resected down to superficial muscle.  The specimen was removed via irrigation and sent separately.  Hemostasis was obtained with cautery.  The Iglesias resectoscope was replaced with a laser bridge and a 0.038 Sensor wire was placed to the working channel and into the right ureteral orifice and advanced proximally without difficulty followed by removal of the resectoscope sheath.  A 4.93F semirigid ureteroscope was then passed per urethra and advanced into  the bladder.  The ureteroscope was easily  advanced into the right UO up to the lower proximal ureter and no tumor was identified.  Ureteroscope was then removed.  The resectoscope was repassed and all biopsy/resection sites were inspected and hemostasis was noted to be adequate.  The resectoscope was removed and an 32F Foley catheter was placed to gravity drainage.  Catheter irrigated with return of pink-tinged effluent upon irrigation.  After anesthetic reversal he was transported to the PACU in stable condition.  Plan: Foley catheter will be removed prior to discharge if urine clear-pink in color He will be notified with the pathology results   Riki Altes, M.D.

## 2023-10-22 NOTE — Anesthesia Postprocedure Evaluation (Signed)
Anesthesia Post Note  Patient: Cameron Mcneil  Procedure(s) Performed: TRANSURETHRAL RESECTION OF BLADDER TUMOR (TURBT) RESTAGING (Bladder) URETEROSCOPY (Right: Ureter)  Patient location during evaluation: PACU Anesthesia Type: General Level of consciousness: awake and alert Pain management: pain level controlled Vital Signs Assessment: post-procedure vital signs reviewed and stable Respiratory status: spontaneous breathing, nonlabored ventilation, respiratory function stable and patient connected to nasal cannula oxygen Cardiovascular status: blood pressure returned to baseline and stable Postop Assessment: no apparent nausea or vomiting Anesthetic complications: no   No notable events documented.   Last Vitals:  Vitals:   10/22/23 1203 10/22/23 1210  BP:  (!) 127/94  Pulse:  64  Resp:  18  Temp: (!) 36.2 C (!) 36.2 C  SpO2:  94%    Last Pain:  Vitals:   10/22/23 1210  TempSrc: Temporal  PainSc: 0-No pain                 Louie Boston

## 2023-10-22 NOTE — Interval H&P Note (Signed)
History and Physical Interval Note:  10/22/2023 9:36 AM  Cameron Mcneil  has presented today for surgery, with the diagnosis of Urothelial Carcinoma of Bladder, Ureteral Tumor.  The various methods of treatment have been discussed with the patient and family. After consideration of risks, benefits and other options for treatment, the patient has consented to  Procedure(s): TRANSURETHRAL RESECTION OF BLADDER TUMOR (TURBT) RESTAGING (N/A) URETEROSCOPY (Right) URETERAL BIOPSY (Right) HOLMIUM LASER APPLICATION OF URETERAL TUMOR (Right) as a surgical intervention.  The patient's history has been reviewed, patient examined, no change in status, stable for surgery.  I have reviewed the patient's chart and labs.  Questions were answered to the patient's satisfaction.    57 y.o. male with high-grade T1 urothelial carcinoma of the bladder presents for restaging TURBT; additional prostatic urethral biopsies and right ureteroscopy with ureteral biopsy and laser ablation of ureteral tumor.  The procedure was discussed in detail and all questions answered.  CV: RRR Lungs: Clear  Jiovani Mccammon C Madlynn Lundeen

## 2023-10-22 NOTE — Transfer of Care (Signed)
Immediate Anesthesia Transfer of Care Note  Patient: Cameron Mcneil  Procedure(s) Performed: TRANSURETHRAL RESECTION OF BLADDER TUMOR (TURBT) RESTAGING (Bladder) URETEROSCOPY (Right: Ureter)  Patient Location: PACU  Anesthesia Type:General  Level of Consciousness: sedated  Airway & Oxygen Therapy: Patient Spontanous Breathing  Post-op Assessment: Report given to RN and Post -op Vital signs reviewed and stable  Post vital signs: Reviewed and stable  Last Vitals:  Vitals Value Taken Time  BP 141/101 10/22/23 1115  Temp 36.1 C 10/22/23 1113  Pulse 74 10/22/23 1116  Resp 16 10/22/23 1116  SpO2 94 % 10/22/23 1116  Vitals shown include unfiled device data.  Last Pain:  Vitals:   10/22/23 0719  PainSc: 0-No pain         Complications: No notable events documented.

## 2023-10-22 NOTE — Anesthesia Preprocedure Evaluation (Signed)
Anesthesia Evaluation  Patient identified by MRN, date of birth, ID band Patient awake    Reviewed: Allergy & Precautions, NPO status , Patient's Chart, lab work & pertinent test results  History of Anesthesia Complications Negative for: history of anesthetic complications  Airway Mallampati: IV  TM Distance: >3 FB Neck ROM: Full    Dental no notable dental hx. (+) Teeth Intact   Pulmonary neg pulmonary ROS, neg sleep apnea, neg COPD, Patient abstained from smoking.Not current smoker Likely undiagnosed OSA   Pulmonary exam normal breath sounds clear to auscultation       Cardiovascular Exercise Tolerance: Good METShypertension, Pt. on medications (-) CAD and (-) Past MI (-) dysrhythmias  Rhythm:Regular Rate:Normal - Systolic murmurs    Neuro/Psych negative neurological ROS  negative psych ROS   GI/Hepatic ,neg GERD  ,,(+)     (-) substance abuse    Endo/Other  diabetes, Well Controlled    Renal/GU negative Renal ROS     Musculoskeletal   Abdominal  (+) + obese  Peds  Hematology   Anesthesia Other Findings Past Medical History: No date: Allergic rhinitis No date: ED (erectile dysfunction) No date: History of noncompliance with medical treatment No date: Hyperlipidemia No date: Hypertension No date: Hypogonadism male No date: T2DM (type 2 diabetes mellitus) (HCC)  Reproductive/Obstetrics                             Anesthesia Physical Anesthesia Plan  ASA: 2  Anesthesia Plan: General   Post-op Pain Management:    Induction: Intravenous  PONV Risk Score and Plan: 3 and Ondansetron, Dexamethasone and Midazolam  Airway Management Planned: LMA  Additional Equipment: None  Intra-op Plan:   Post-operative Plan: Extubation in OR  Informed Consent: I have reviewed the patients History and Physical, chart, labs and discussed the procedure including the risks, benefits and  alternatives for the proposed anesthesia with the patient or authorized representative who has indicated his/her understanding and acceptance.     Dental advisory given  Plan Discussed with: CRNA and Surgeon  Anesthesia Plan Comments: (Discussed risks of anesthesia with patient, including PONV, sore throat, lip/dental/eye damage. Rare risks discussed as well, such as cardiorespiratory and neurological sequelae, and allergic reactions. Discussed the role of CRNA in patient's perioperative care. Patient understands.)       Anesthesia Quick Evaluation

## 2023-10-22 NOTE — Discharge Instructions (Signed)
Transurethral Resection of Bladder Tumor (TURBT) or Bladder Biopsy   Definition:  Transurethral Resection of the Bladder Tumor is a surgical procedure used to diagnose and remove tumors within the bladder.   General instructions:     Your recent bladder surgery requires very little post hospital care but some definite precautions.  Despite the fact that no skin incisions were used, the area around the bladder incisions are raw and covered with scabs to promote healing and prevent bleeding. Certain precautions are needed to insure that the scabs are not disturbed over the next 2-4 weeks while the healing proceeds.  Because the raw surface inside your bladder and the irritating effects of urine you may expect frequency of urination and/or urgency (a stronger desire to urinate) and perhaps even getting up at night more often. This will usually resolve or improve slowly over the healing period. You may see some blood in your urine over the first 6 weeks. Do not be alarmed, even if the urine was clear for a while. Get off your feet and drink lots of fluids until clearing occurs. If you start to pass clots or don't improve call us.  Diet:  You may return to your normal diet immediately. Because of the raw surface of your bladder, alcohol, spicy foods, foods high in acid and drinks with caffeine may cause irritation or frequency and should be used in moderation. To keep your urine flowing freely and avoid constipation, drink plenty of fluids during the day (8-10 glasses). Tip: Avoid cranberry juice because it is very acidic.  Activity:  Your physical activity doesn't need to be restricted. However, if you are very active, you may see some blood in the urine. We suggest that you reduce your activity under the circumstances until the bleeding has stopped.  Bowels:  It is important to keep your bowels regular during the postoperative period. Straining with bowel movements can cause bleeding. A bowel  movement every other day is reasonable. Use a mild laxative if needed, such as milk of magnesia 2-3 tablespoons, or 2 Dulcolax tablets. Call if you continue to have problems. If you had been taking narcotics for pain, before, during or after your surgery, you may be constipated. Take a laxative if necessary.    Medication:  You should resume your pre-surgery medications unless told not to. In addition you may be given an antibiotic to prevent or treat infection. Antibiotics are not always necessary. All medication should be taken as prescribed until the bottles are finished unless you are having an unusual reaction to one of the drugs.   [facility] [address] [place], [place] [address] [phone]

## 2023-10-23 ENCOUNTER — Encounter: Payer: Self-pay | Admitting: Urology

## 2023-10-25 LAB — SURGICAL PATHOLOGY

## 2023-11-04 ENCOUNTER — Encounter: Payer: Self-pay | Admitting: Urology

## 2023-11-04 ENCOUNTER — Ambulatory Visit (INDEPENDENT_AMBULATORY_CARE_PROVIDER_SITE_OTHER): Payer: 59 | Admitting: Urology

## 2023-11-04 VITALS — BP 130/80 | HR 76 | Ht 74.0 in | Wt 269.0 lb

## 2023-11-04 DIAGNOSIS — C679 Malignant neoplasm of bladder, unspecified: Secondary | ICD-10-CM

## 2023-11-04 NOTE — Progress Notes (Signed)
I, Cameron Mcneil, acting as a scribe for Cameron Altes, MD., have documented all relevant documentation on the behalf of Cameron Altes, MD, as directed by Cameron Altes, MD while in the presence of Cameron Altes, MD.  11/04/2023 5:06 PM   Cameron Mcneil 1965/12/07 [id number]  Referring provider: Joaquim Nam, MD [address] [place],  [place] [address]  Chief Complaint  Patient presents with   Results   Urologic history: 1. Multifocal urothelial carcinoma of the bladder   2. Possible urothelial carcinoma, right distal ureter  HPI: Cameron Mcneil is a 57 y.o. male presents for post-op follow-up.  The restaging TUR/biopsies 10/22/2023. His intraoperative findings were remarkable for prior resection sites with fibrinous exudate. Biopsies of these bases were obtained. Additional papillary tumors were identified, which were resected. Right ureteroscopy showed no evidence of ureteral tumor. Deeper resection prostatic urethral was taken.  He is having urinary frequency and urgency, which is to be expected and spasms at the end of urination.  Pathology: resected tumors, again showed high-grade papillary urethelial carcinoma with focal invasion into the lamin appropriate. No muscle invasion was seen. Biopsies of previous resected sites, again showed no evidence of muscle invasion. Prostatic erythrorhizections, again showed high-grade urethelial carcinoma. No prostatic glandular tissue was identified.    PMH: Past Medical History:  Diagnosis Date   Allergic rhinitis    Cancer (HCC)    bladder   ED (erectile dysfunction)    History of kidney stones    History of noncompliance with medical treatment    Hyperlipidemia    Hypertension    Hypogonadism male    T2DM (type 2 diabetes mellitus) (HCC)     Surgical History: Past Surgical History:  Procedure Laterality Date   COLONOSCOPY  05/17/2021   poor prep- repeated on 06/09/21 Cameron Mcneil   CYSTOSCOPY N/A  09/24/2023   Procedure: DIAGNOSTIC CYSTOSCOPY;  Surgeon: Cameron Altes, MD;  Location: [facility] ORS;  Service: Urology;  Laterality: N/A;   ORIF ANKLE FRACTURE Right 11/12/2013   Procedure: OPEN REDUCTION INTERNAL FIXATION (ORIF) ANKLE FRACTURE/ irrigation and debridement of right forefoot wound  dehisence;  Surgeon: Cameron Arthurs, MD;  Location: [facility] OR;  Service: Orthopedics;  Laterality: Right;   TOE SURGERY Right 11/06/2013   [facility]     HALLUX           Cameron  Mcneil   TRANSURETHRAL RESECTION OF BLADDER TUMOR N/A 09/24/2023   Procedure: TRANSURETHRAL RESECTION OF BLADDER TUMOR (TURBT);  Surgeon: Cameron Altes, MD;  Location: [facility] ORS;  Service: Urology;  Laterality: N/A;   TRANSURETHRAL RESECTION OF BLADDER TUMOR N/A 10/22/2023   Procedure: TRANSURETHRAL RESECTION OF BLADDER TUMOR (TURBT) RESTAGING;  Surgeon: Cameron Altes, MD;  Location: [facility] ORS;  Service: Urology;  Laterality: N/A;   URETEROSCOPY Right 10/22/2023   Procedure: URETEROSCOPY;  Surgeon: Cameron Altes, MD;  Location: [facility] ORS;  Service: Urology;  Laterality: Right;    Home Medications:  Allergies as of 11/04/2023   No Known Allergies      Medication List        Accurate as of November 04, 2023  5:06 PM. If you have any questions, ask your nurse or doctor.          amLODipine 5 MG tablet Commonly known as: NORVASC Take 1 tablet (5 mg total) by mouth daily. for blood pressure.   Gemtesa 75 MG Tabs Generic drug: Vibegron Take 1 tablet (75 mg  total) by mouth daily.   HYDROcodone-acetaminophen 5-325 MG tablet Commonly known as: NORCO/VICODIN Take 1 tablet by mouth every 4 (four) hours as needed for moderate pain (pain score 4-6).   losartan 100 MG tablet Commonly known as: COZAAR TAKE 0.5-1 TABLETS (50-100 MG TOTAL) BY MOUTH DAILY.   multivitamin tablet Take 1 tablet by mouth daily.   oxybutynin 5 MG tablet Commonly known as: DITROPAN 1 tab tid prn frequency,urgency, bladder spasm        Allergies: No  Known Allergies  Family History: Family History  Problem Relation Age of Onset   Diabetes Other    Prostate cancer Neg Hx    Colon cancer Neg Hx    Colon polyps Neg Hx    Esophageal cancer Neg Hx    Rectal cancer Neg Hx    Stomach cancer Neg Hx     Social History:  reports that he has never smoked. He has never used smokeless tobacco. He reports current alcohol use. He reports that he does not use drugs.   Physical Exam: BP 130/80   Pulse 76   Ht 6\' 2"  (1.88 m)   Wt 269 lb (122 kg)   BMI 34.54 kg/m   Constitutional:  Alert and oriented, No acute distress. HEENT: [place] AT, moist mucus membranes.  Trachea midline, no masses. Cardiovascular: No clubbing, cyanosis, or edema. Respiratory: Normal respiratory effort, no increased work of breathing. GI: Abdomen is soft, nontender, nondistended, no abdominal masses Skin: No rashes, bruises or suspicious lesions. Neurologic: Grossly intact, no focal deficits, moving all 4 extremities. Psychiatric: Normal mood and affect.   Assessment & Plan:    1. High-grade T1 urothelial carcinoma of the bladder; restaging showed no evidence of muscle invasion.  Prostatic urethral resections have not shown prostatic tissue and only urethelial carcinoma. Indeterminant at this time, if this involves more extensive prostatic invasion.  I asked one of my colleagues to review findings and based on age, he was in agreement with a 6 week induction BCG and follow-up biopsies after BCG completed. Will schedule a 6 week induction BCG to start in approximately 4 weeks. Side effects have been previously discussed.  Would recommend cystoscopy under anesthesia and follow-up biopsies 6-8 weeks after induction BCG completed.  [facility] [address], [address] [place], [place] [address] [phone]

## 2023-11-07 ENCOUNTER — Encounter: Payer: Self-pay | Admitting: Urology

## 2023-11-24 ENCOUNTER — Encounter: Payer: Self-pay | Admitting: Family Medicine

## 2023-11-29 MED ORDER — BLOOD GLUCOSE TEST VI STRP: 1.0000 | ORAL_STRIP | Freq: Every day | 3 refills | Status: AC

## 2023-11-29 MED ORDER — BLOOD GLUCOSE MONITORING SUPPL DEVI: 1.0000 | Freq: Three times a day (TID) | 0 refills | Status: AC

## 2023-11-29 MED ORDER — LANCET DEVICE MISC: 1.0000 | Freq: Once | 0 refills | Status: AC

## 2023-11-29 MED ORDER — LANCETS MISC. MISC: 1.0000 | Freq: Every day | 3 refills | Status: AC

## 2023-11-29 NOTE — Telephone Encounter (Signed)
 Please send meter/strips/lancets.  Check sugar daily as needed.  Dx E11.9.  thanks.

## 2023-12-06 ENCOUNTER — Telehealth: Payer: Self-pay | Admitting: *Deleted

## 2023-12-06 NOTE — Telephone Encounter (Signed)
 He is scheduled for BCG 12/09/2022

## 2023-12-06 NOTE — Progress Notes (Signed)
 12/10/2023 12:20 PM   Cameron Mcneil May 14, 1966 [id number]  Referring provider: Cleatus Arlyss RAMAN, MD [address] [place],  [place] [address]  Urological history: 1.  High risk hematuria -non - smoker -CTU (12/2022) - right Bosniak 1 cyst -cysto/TURBT (08/2023) - 2 nodular tumors left posterior wall measuring ~ 1.5 and 1 cm, 1.5 cm nodular tumor left lateral wall, 3 cm papillary tumor anterior to right hemitrigone extending to the bladder neck, 1 cm nodular tumor upper mid posterior wall x 2, 5 mm nodular lesion right prostatic urethra just proximal to the veru,  area early papillary change left proximal prostatic urethra at bladder neck and papillary tumor emanating left UO -high-grade papillary urothelial carcinoma with extensive lamina propria invasion  2. High grade T1 urothelial carcinoma of the bladder -cysto/TURBT (08/2023) - 2 nodular tumors left posterior wall measuring ~ 1.5 and 1 cm, 1.5 cm nodular tumor left lateral wall, 3 cm papillary tumor anterior to right hemitrigone extending to the bladder neck, 1 cm nodular tumor upper mid posterior wall x 2, 5 mm nodular lesion right prostatic urethra just proximal to the veru,  area early papillary change left proximal prostatic urethra at bladder neck and papillary tumor emanating left UO -high-grade papillary urothelial carcinoma with extensive lamina propria invasion -second look TURBT (09/2023) - Pathology: resected tumors, again showed high-grade papillary urothelial carcinoma with focal invasion into the lamina propria. No muscle invasion was seen. Biopsies of previous resected sites, again showed no evidence of muscle invasion. Prostatic urethral resection again showed high-grade urothelial carcinoma. No prostatic glandular tissue was identified.   3. BPH with LU TS -PSA (09/2022) 0.51   Chief Complaint  Patient presents with   Follow-up    BCG    HPI: Cameron Mcneil is a 58 y.o. male who presents today for  first BCG installment with his friend, Luke.   Previous records reviewed.   He states he has been have persistent gross hematuria over the last week.  He has also had some dysuria.  Patient denies any modifying or aggravating factors.  Patient denies any suprapubic/flank pain.  Patient denies any fevers, chills, nausea or vomiting.    UA red cloudy, 6-10 WBC, > 30 RBC's and moderate bacteria.   PMH: Past Medical History:  Diagnosis Date   Allergic rhinitis    Cancer (HCC)    bladder   ED (erectile dysfunction)    History of kidney stones    History of noncompliance with medical treatment    Hyperlipidemia    Hypertension    Hypogonadism male    T2DM (type 2 diabetes mellitus) (HCC)     Surgical History: Past Surgical History:  Procedure Laterality Date   COLONOSCOPY  05/17/2021   poor prep- repeated on 06/09/21 Dr Teressa   CYSTOSCOPY N/A 09/24/2023   Procedure: DIAGNOSTIC CYSTOSCOPY;  Surgeon: Twylla Glendia BROCKS, MD;  Location: [facility] ORS;  Service: Urology;  Laterality: N/A;   ORIF ANKLE FRACTURE Right 11/12/2013   Procedure: OPEN REDUCTION INTERNAL FIXATION (ORIF) ANKLE FRACTURE/ irrigation and debridement of right forefoot wound  dehisence;  Surgeon: Norleen Armor, MD;  Location: [facility];  Service: Orthopedics;  Laterality: Right;   TOE SURGERY Right 11/06/2013   MPJ     HALLUX           DR  HYATT   TRANSURETHRAL RESECTION OF BLADDER TUMOR N/A 09/24/2023   Procedure: TRANSURETHRAL RESECTION OF BLADDER TUMOR (TURBT);  Surgeon: Twylla Glendia BROCKS, MD;  Location: [facility] OR[facility];  Service: Urology;  Laterality: N/A;   TRANSURETHRAL RESECTION OF BLADDER TUMOR N/A 10/22/2023   Procedure: TRANSURETHRAL RESECTION OF BLADDER TUMOR (TURBT) RESTAGING;  Surgeon: Twylla Glendia BROCKS, MD;  Location: [facility] OR[facility];  Service: Urology;  Laterality: N/A;   URETEROSCOPY Right 10/22/2023   Procedure: URETEROSCOPY;  Surgeon: Twylla Glendia BROCKS, MD;  Location: [facility] OR[facility];  Service: Urology;  Laterality: Right;    Home  Medications:  Allergies as of 12/10/2023   No Known Allergies      Medication List        Accurate as of December 10, 2023 12:20 PM. If you have any questions, ask your nurse or doctor.          STOP taking these medications    Gemtesa  75 MG Tabs Generic drug: Vibegron  Stopped by: Aalijah Mims   oxybutynin  5 MG tablet Commonly known as: DITROPAN  Stopped by: Park Beck       TAKE these medications    amLODipine  5 MG tablet Commonly known as: NORVASC  Take 1 tablet (5 mg total) by mouth daily. for blood pressure.   Blood Glucose Monitoring Suppl Devi 1 each by Does not apply route in the morning, at noon, and at bedtime. May substitute to any manufacturer covered by patient's insurance.   BLOOD GLUCOSE TEST STRIPS Strp 1 each by In Vitro route daily. May substitute to any manufacturer covered by patient's insurance.   HYDROcodone -acetaminophen  5-325 MG tablet Commonly known as: NORCO/VICODIN Take 1 tablet by mouth every 4 (four) hours as needed for moderate pain (pain score 4-6).   Lancets Misc. Misc 1 each by Does not apply route daily. May substitute to any manufacturer covered by patient's insurance.   losartan  100 MG tablet Commonly known as: COZAAR  TAKE 0.5-1 TABLETS (50-100 MG TOTAL) BY MOUTH DAILY.   multivitamin tablet Take 1 tablet by mouth daily.        Allergies: No Known Allergies  Family History: Family History  Problem Relation Age of Onset   Diabetes Other    Prostate cancer Neg Hx    Colon cancer Neg Hx    Colon polyps Neg Hx    Esophageal cancer Neg Hx    Rectal cancer Neg Hx    Stomach cancer Neg Hx     Social History:  reports that he has never smoked. He has never been exposed to tobacco smoke. He has never used smokeless tobacco. He reports current alcohol use. He reports that he does not use drugs.  ROS: Pertinent ROS in HPI  Physical Exam: BP (!) 172/126   Pulse 72   Ht 6' 2 (1.88 m)   Wt 265 lb (120.2 kg)    BMI 34.02 kg/m   Constitutional:  Well nourished. Alert and oriented, No acute distress. HEENT: [place] AT, moist mucus membranes.  Trachea midline Cardiovascular: No clubbing, cyanosis, or edema. Respiratory: Normal respiratory effort, no increased work of breathing. Neurologic: Grossly intact, no focal deficits, moving all 4 extremities. Psychiatric: Normal mood and affect.  Laboratory Data: Lab Results  Component Value Date   WBC 6.8 09/17/2023   HGB 16.0 09/17/2023   HCT 47.8 09/17/2023   MCV 79.9 (L) 09/17/2023   PLT 243 09/17/2023   Lab Results  Component Value Date   CREATININE 0.98 09/17/2023   Lab Results  Component Value Date   PSA 0.51 10/23/2022   PSA 0.52 03/22/2022   PSA 0.64 04/03/2021   Lab Results  Component Value Date   HGBA1C 7.2 (  H) 05/06/2023    Lab Results  Component Value Date   TSH 2.71 02/19/2013   Urinalysis See EPIC and HPI  I have reviewed the labs.   Pertinent Imaging: N/A   Assessment & Plan:    1. Urothelial carcinoma of bladder (HCC) (Primary) - Urinalysis, Complete -Explained that we will hold BCG therapy today as he is having gross hematuria and we will rule out infection -Going forward if the hematuria persists we may need to have BCG therapy in spite of this due to his high tumor volume  2. Gross hematuria - UA w/ pyuria, hematuria and moderate bacteria  - CULTURE, URINE COMPREHENSIVE -Discussed with him and his wife that we may need to pursue BCG therapy even in the setting of gross hematuria due to his high tumor volume, but will discuss that with Dr. Twylla before next week -I will not prescribe an antibiotic at this time, but we will wait on preliminary results of the urine culture and if it indicates that there may be infection will prescribe antibiotic at that time  Return in about 1 week (around 12/17/2023) for BCG # 1 .  These notes generated with voice recognition software. I apologize for typographical  errors.  CLOTILDA HELON RIGGERS  [facility] [address]  [address] [place], [place] [address] [phone]

## 2023-12-06 NOTE — Telephone Encounter (Signed)
 Pt calling asking if records where sent to his life insurance company ( form scanned in chart) pt also supposed to set up for BCG per last OV with stoioff.   Scarlette Calico will be working on records request, I will forward BCG to [employer]

## 2023-12-10 ENCOUNTER — Encounter: Payer: Self-pay | Admitting: Urology

## 2023-12-10 ENCOUNTER — Ambulatory Visit (INDEPENDENT_AMBULATORY_CARE_PROVIDER_SITE_OTHER): Payer: Self-pay | Admitting: Urology

## 2023-12-10 VITALS — BP 172/126 | HR 72 | Ht 74.0 in | Wt 265.0 lb

## 2023-12-10 DIAGNOSIS — R310 Gross hematuria: Secondary | ICD-10-CM

## 2023-12-10 DIAGNOSIS — C679 Malignant neoplasm of bladder, unspecified: Secondary | ICD-10-CM

## 2023-12-10 LAB — MICROSCOPIC EXAMINATION: RBC, Urine: >30 /[HPF] — AB (ref 0–2)

## 2023-12-10 LAB — URINALYSIS, COMPLETE

## 2023-12-10 NOTE — Patient Instructions (Signed)

## 2023-12-13 LAB — CULTURE, URINE COMPREHENSIVE

## 2023-12-16 ENCOUNTER — Other Ambulatory Visit: Payer: Self-pay | Admitting: Urology

## 2023-12-16 ENCOUNTER — Telehealth: Payer: Self-pay | Admitting: Urology

## 2023-12-16 DIAGNOSIS — C679 Malignant neoplasm of bladder, unspecified: Secondary | ICD-10-CM

## 2023-12-16 NOTE — Telephone Encounter (Signed)
Would you let Cameron Mcneil know that he does not need to come tomorrow for his BCG treatment as we referring him to [facility] urology?   Will go ahead and cancel the 6 weekly treatments.

## 2023-12-17 ENCOUNTER — Ambulatory Visit: Payer: 59 | Admitting: Urology

## 2023-12-20 ENCOUNTER — Other Ambulatory Visit: Payer: Self-pay | Admitting: Urology

## 2023-12-20 DIAGNOSIS — R310 Gross hematuria: Secondary | ICD-10-CM

## 2023-12-20 DIAGNOSIS — C679 Malignant neoplasm of bladder, unspecified: Secondary | ICD-10-CM

## 2023-12-24 ENCOUNTER — Ambulatory Visit: Payer: 59 | Admitting: Urology

## 2023-12-30 ENCOUNTER — Telehealth: Payer: Self-pay | Admitting: Urology

## 2023-12-30 NOTE — Telephone Encounter (Signed)
Charlene spoke [place] with [facility] and she stated that pt no-showed and didn't call back. Stated that they will not reach back out to him since they called 3x and that they were going to cancel he's referral and the only way to make an appt is for the pt to speak wit her directly but if he makes another appt and no show then he's unable to come to [facility].

## 2023-12-31 ENCOUNTER — Ambulatory Visit: Payer: 59 | Admitting: Urology

## 2023-12-31 DIAGNOSIS — Z01818 Encounter for other preprocedural examination: Secondary | ICD-10-CM | POA: Diagnosis not present

## 2023-12-31 DIAGNOSIS — Z8551 Personal history of malignant neoplasm of bladder: Secondary | ICD-10-CM | POA: Diagnosis not present

## 2023-12-31 DIAGNOSIS — C689 Malignant neoplasm of urinary organ, unspecified: Secondary | ICD-10-CM | POA: Diagnosis not present

## 2024-01-06 DIAGNOSIS — I10 Essential (primary) hypertension: Secondary | ICD-10-CM | POA: Diagnosis not present

## 2024-01-06 DIAGNOSIS — Z538 Procedure and treatment not carried out for other reasons: Secondary | ICD-10-CM | POA: Diagnosis not present

## 2024-01-06 DIAGNOSIS — R310 Gross hematuria: Secondary | ICD-10-CM | POA: Diagnosis not present

## 2024-01-06 DIAGNOSIS — Z79899 Other long term (current) drug therapy: Secondary | ICD-10-CM | POA: Diagnosis not present

## 2024-01-06 DIAGNOSIS — C679 Malignant neoplasm of bladder, unspecified: Secondary | ICD-10-CM | POA: Diagnosis not present

## 2024-01-06 DIAGNOSIS — E119 Type 2 diabetes mellitus without complications: Secondary | ICD-10-CM | POA: Diagnosis not present

## 2024-01-07 ENCOUNTER — Ambulatory Visit: Payer: 59 | Admitting: Urology

## 2024-01-14 ENCOUNTER — Ambulatory Visit: Payer: 59 | Admitting: Urology

## 2024-01-20 DIAGNOSIS — C675 Malignant neoplasm of bladder neck: Secondary | ICD-10-CM | POA: Diagnosis not present

## 2024-01-20 DIAGNOSIS — C689 Malignant neoplasm of urinary organ, unspecified: Secondary | ICD-10-CM | POA: Diagnosis not present

## 2024-01-20 DIAGNOSIS — C678 Malignant neoplasm of overlapping sites of bladder: Secondary | ICD-10-CM | POA: Diagnosis not present

## 2024-01-21 ENCOUNTER — Ambulatory Visit: Payer: Self-pay | Admitting: Urology

## 2024-01-30 DIAGNOSIS — Z8551 Personal history of malignant neoplasm of bladder: Secondary | ICD-10-CM | POA: Diagnosis not present

## 2024-01-30 DIAGNOSIS — Z08 Encounter for follow-up examination after completed treatment for malignant neoplasm: Secondary | ICD-10-CM | POA: Diagnosis not present

## 2024-01-30 DIAGNOSIS — Z466 Encounter for fitting and adjustment of urinary device: Secondary | ICD-10-CM | POA: Diagnosis not present

## 2024-02-02 ENCOUNTER — Telehealth: Payer: Self-pay | Admitting: Family Medicine

## 2024-02-02 DIAGNOSIS — E1129 Type 2 diabetes mellitus with other diabetic kidney complication: Secondary | ICD-10-CM

## 2024-02-02 NOTE — Telephone Encounter (Signed)
 Please update patient- they will get a letter/mychart message about this.    The hospital system discovered a software error in a system at the laboratory at [facility] at [address] in [place] that examines kidney function. The patient's results were incorrectly calculated on this test.  The test is called uACR. It measures the amount of albumin (a protein) in the urine relative to creatinine (a waste product). This test is one of several factors used to judge kidney function. The laboratory software miscalculated the ratio of one to the other. The measurements themselves were correct. The software error has been fixed.  I went back and checked the patient's lab results and medicines.    At this point, it does not appear that the patient needs to make any medicine changes.  I would like to recheck his labs at a yearly visit this summer.  If the patient has any questions or concerns about this, please schedule a visit/let me know.   I saw his notes about his recent treatment and I wish him the best.    Thanks.

## 2024-02-07 DIAGNOSIS — R339 Retention of urine, unspecified: Secondary | ICD-10-CM | POA: Diagnosis not present

## 2024-02-07 DIAGNOSIS — N400 Enlarged prostate without lower urinary tract symptoms: Secondary | ICD-10-CM | POA: Diagnosis not present

## 2024-02-07 DIAGNOSIS — R9349 Abnormal radiologic findings on diagnostic imaging of other urinary organs: Secondary | ICD-10-CM | POA: Diagnosis not present

## 2024-02-07 DIAGNOSIS — R102 Pelvic and perineal pain: Secondary | ICD-10-CM | POA: Diagnosis not present

## 2024-02-07 DIAGNOSIS — I959 Hypotension, unspecified: Secondary | ICD-10-CM | POA: Diagnosis not present

## 2024-02-07 DIAGNOSIS — R319 Hematuria, unspecified: Secondary | ICD-10-CM | POA: Diagnosis not present

## 2024-02-07 DIAGNOSIS — R1030 Lower abdominal pain, unspecified: Secondary | ICD-10-CM | POA: Diagnosis not present

## 2024-02-07 DIAGNOSIS — C61 Malignant neoplasm of prostate: Secondary | ICD-10-CM | POA: Diagnosis not present

## 2024-02-07 DIAGNOSIS — E119 Type 2 diabetes mellitus without complications: Secondary | ICD-10-CM | POA: Diagnosis not present

## 2024-02-07 DIAGNOSIS — R310 Gross hematuria: Secondary | ICD-10-CM | POA: Diagnosis not present

## 2024-02-07 DIAGNOSIS — Z8551 Personal history of malignant neoplasm of bladder: Secondary | ICD-10-CM | POA: Diagnosis not present

## 2024-02-07 DIAGNOSIS — E872 Acidosis, unspecified: Secondary | ICD-10-CM | POA: Diagnosis not present

## 2024-02-07 DIAGNOSIS — I10 Essential (primary) hypertension: Secondary | ICD-10-CM | POA: Diagnosis not present

## 2024-02-07 DIAGNOSIS — Z960 Presence of urogenital implants: Secondary | ICD-10-CM | POA: Diagnosis not present

## 2024-02-07 IMAGING — US US RENAL
1 series · 14 of 25 positions shown · non-contrast
Comparison: November 07, 2021

CLINICAL DATA: Urinary retention

EXAM:
RENAL / URINARY TRACT ULTRASOUND COMPLETE

[Series 1: us renal · 14 of 59 slices shown]
[im 1/59]
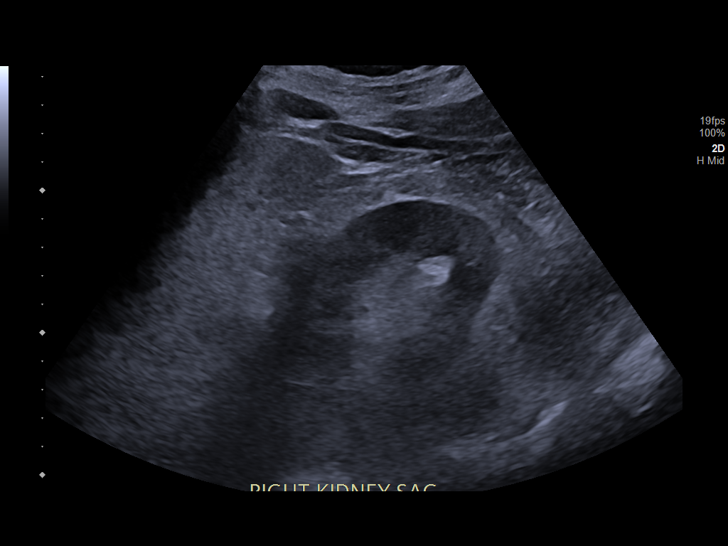
[im 5/59]
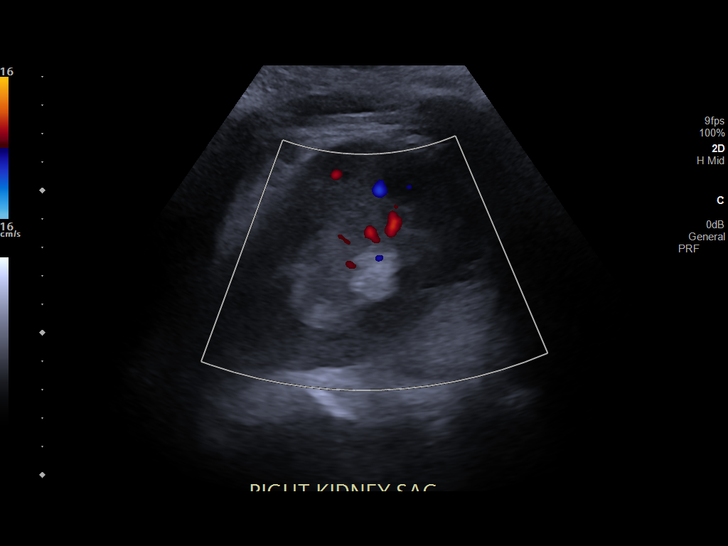
[im 10/59]
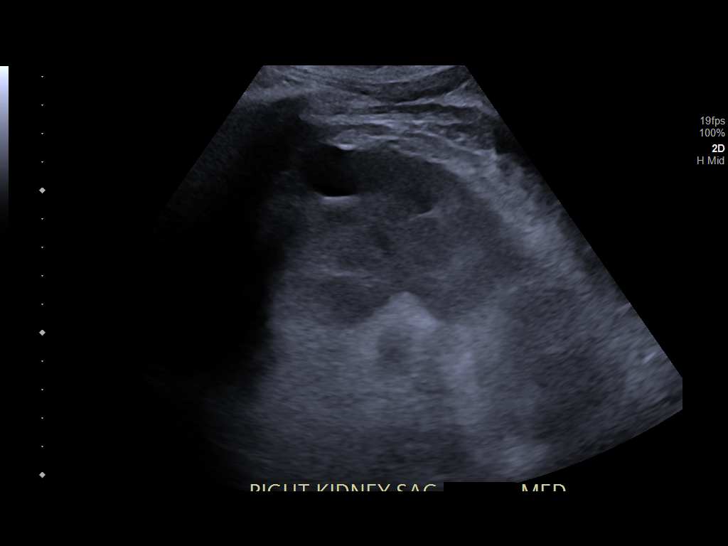
[im 15/59]
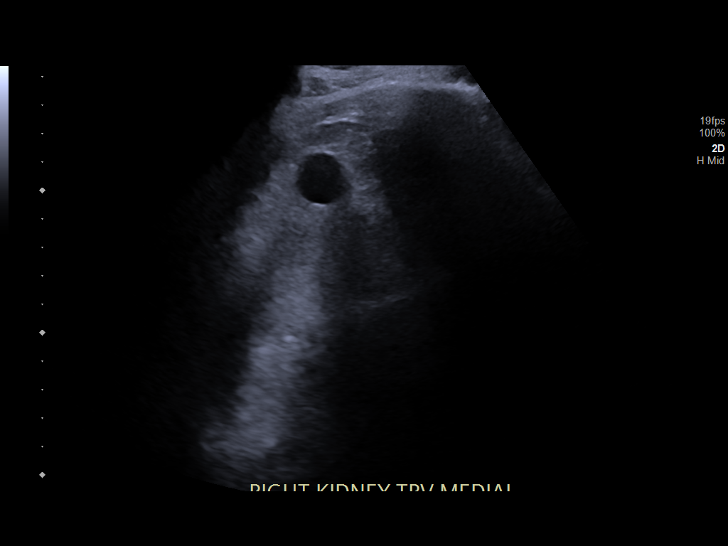
[im 20/59]
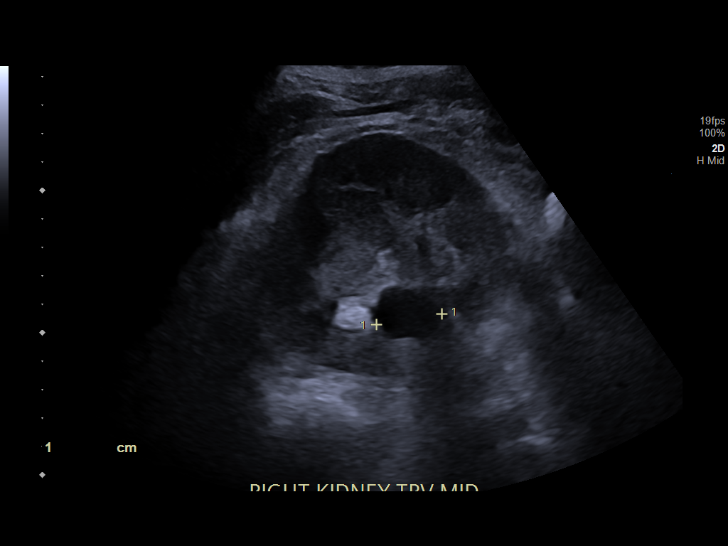
[im 22/59]
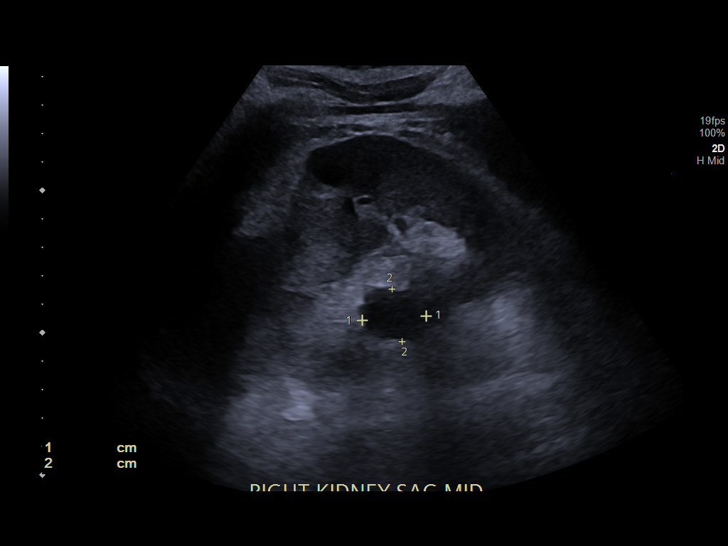
[im 27/59]
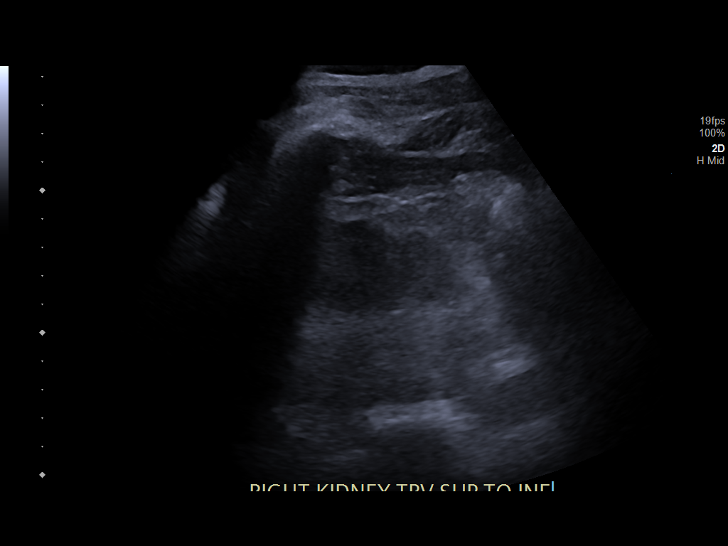
[im 32/59]
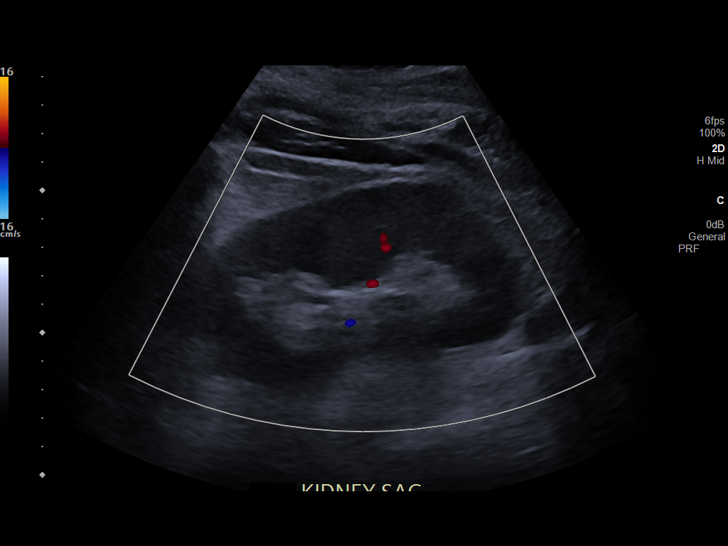
[im 37/59]
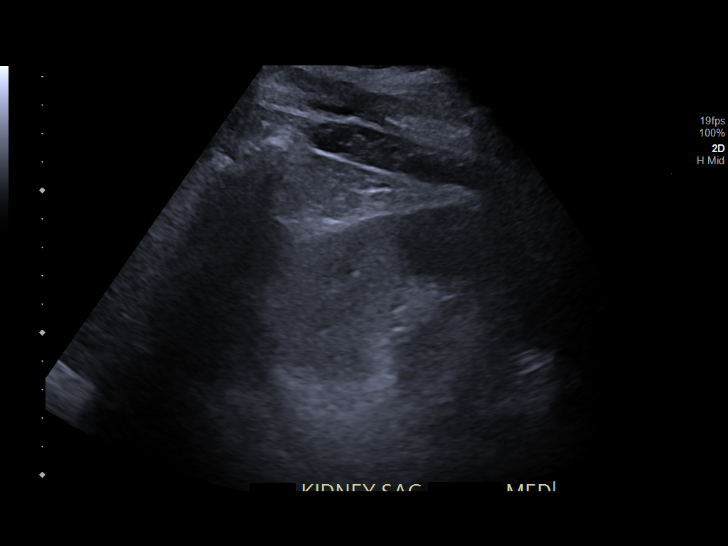
[im 39/59]
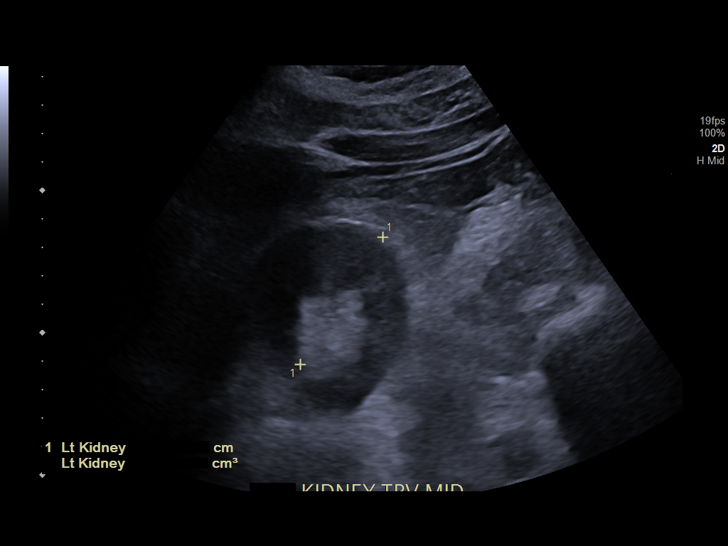
[im 44/59]
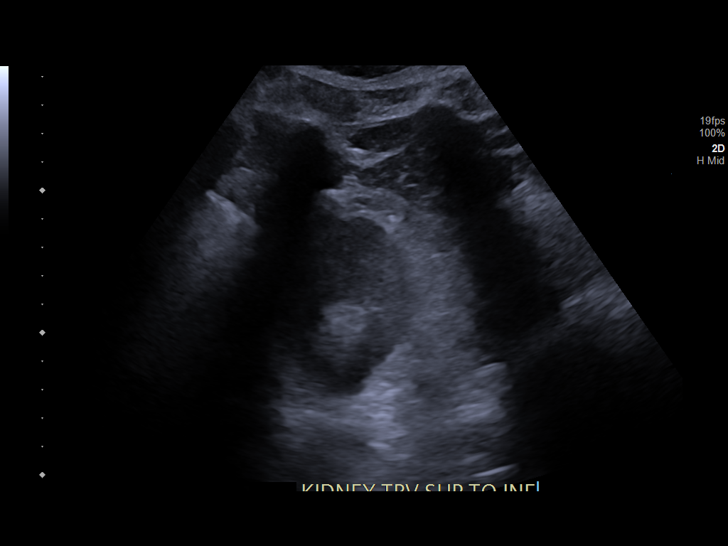
[im 49/59]
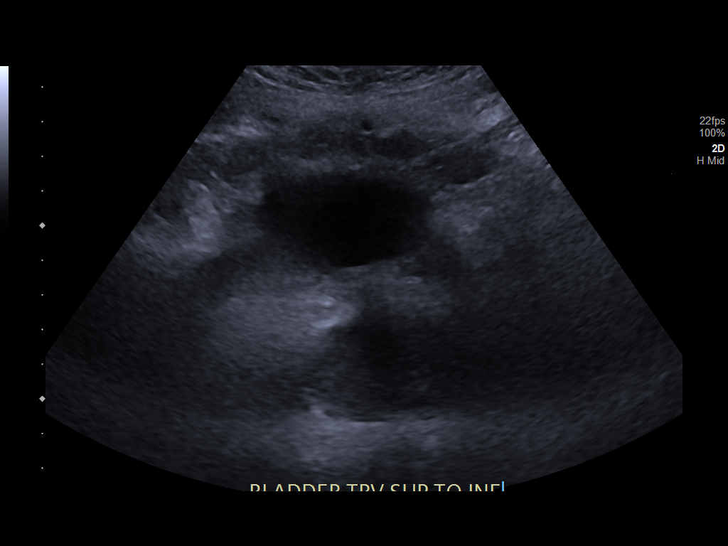
[im 54/59]
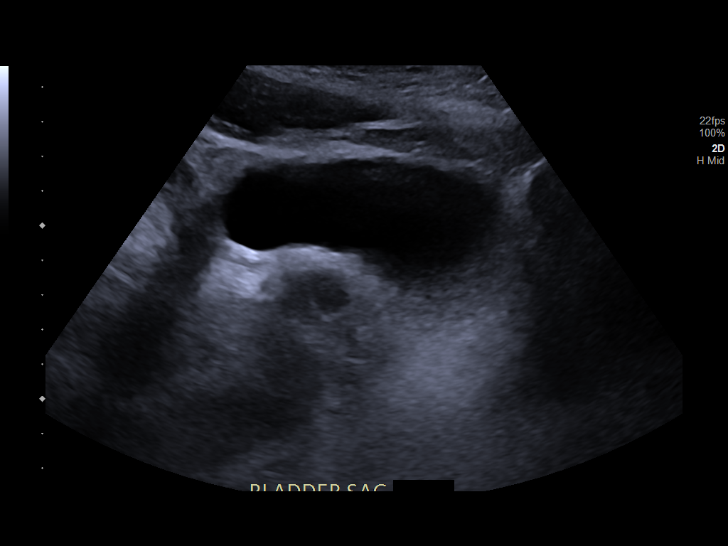
[im 59/59]
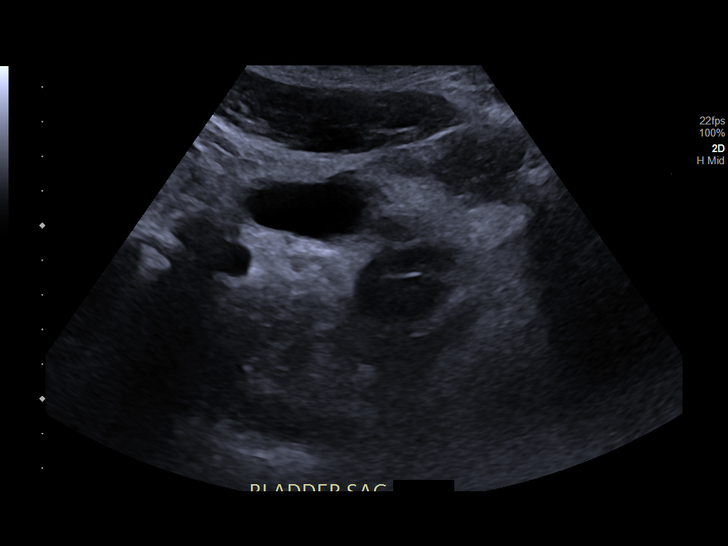

[14 of 25 positions shown; findings below may reference images not displayed]

FINDINGS: Right Kidney:

Renal measurements: 11.1 x 6.6 x 7.3 cm = volume: 281 mL. Contains 2
simple cysts measuring 2.1 and 2.3 cm. The simple cysts are of no
clinical significance. No follow-up imaging recommended.

Left Kidney:

Renal measurements: 11.7 x 5.9 x 5.3 cm = volume: 194 mL.
Echogenicity within normal limits. No mass or hydronephrosis
visualized.

Bladder:

Appears normal for degree of bladder distention.

Other:

None.
IMPRESSION: No cause for urinary retention identified. No acute abnormalities.
Postvoid images were not obtained of the bladder.

## 2024-02-07 IMAGING — RF DG FLUORO GUIDE NDL PLC/BX
1 series · 1 of 1 positions shown · IV contrast (agent unspecified)
Comparison: none

Addendum:
CLINICAL DATA: Patient with acute right shoulder pain. Request for
right shoulder arthrogram for MR shoulder with contrast.

[Series 1: cp_standard · 0.17mm/px · 1 of 1 slices shown]
[im 1/1]
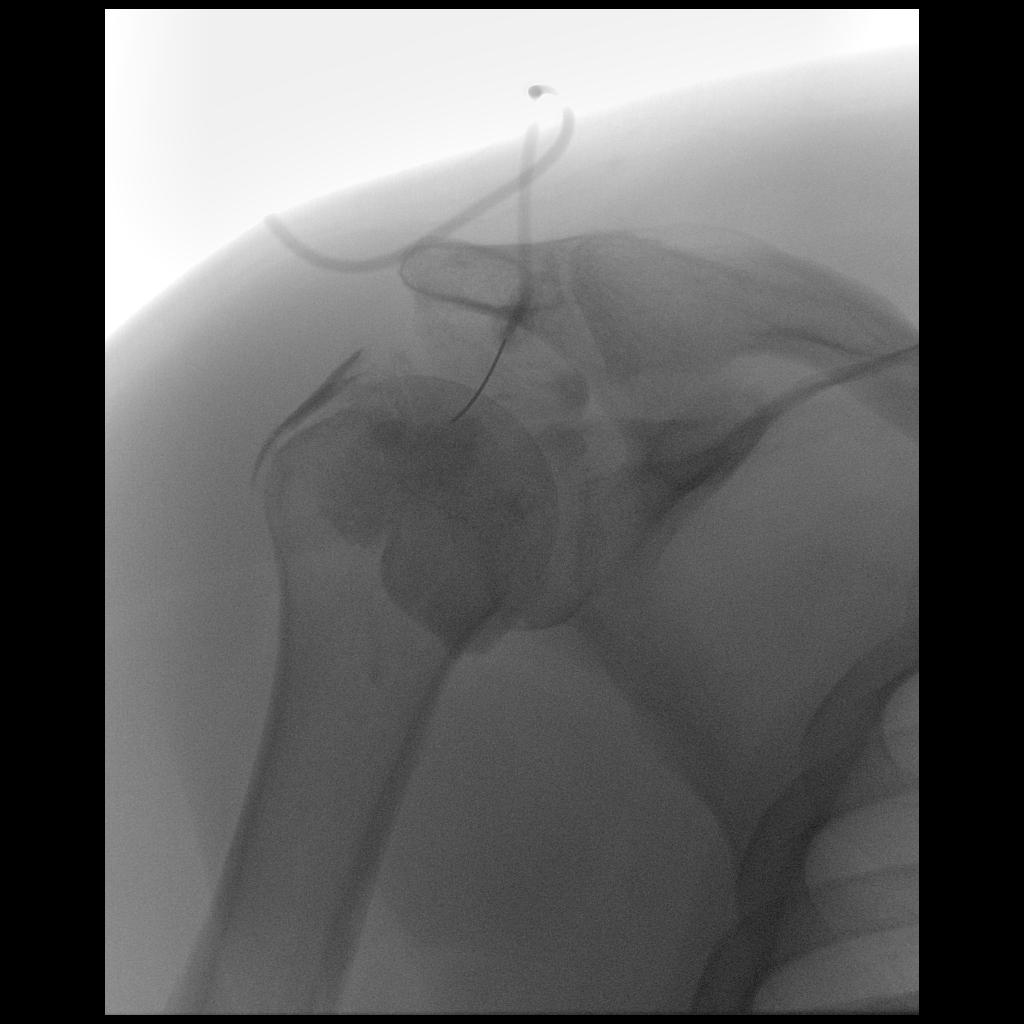

[1 of 1 positions shown; findings below may reference images not displayed]

EXAM:
SHOULDER INJECTION FOR MRI

PROCEDURE:
After a thorough discussion of risks and benefits of the procedure,
written and oral informed consent was obtained. The consent
discussion included the risk of bleeding, infection and injury to
nerves and adjacent blood vessels. Extra-articular injection was
also a possible risk discussed.

Preliminary localization was performed over the right shoulder. The
area was marked over superior medial anterior humeral head.

After prep and drape in the usual sterile fashion, the skin and
deeper subcutaneous tissues were anesthetized with 1% Lidocaine
without Epinephrine. Under fluoroscopic guidance, a 22 gauge
inch spinal needle was advanced into the joint over the superior
medial anterior humeral head using an anterior approach.
Intra-articular injection of Lidocaine was performed which flowed
freely and subsequently the joint was distended with 11 ml of
contrast. The MR arthrogram solution was as follows: 15 ml of
Omnipaque 180 contrast agent, 0.05 mL Gadavist, 5 mL saline. The
needle removed, and a sterile dressing applied. The patient was
taken to MRI for subsequent imaging.

The patient tolerated the procedure well and there were no
complications.

FLUOROSCOPY:
Radiation Exposure Index (as provided by the fluoroscopic device):
2.00 mGy Kerma
IMPRESSION: Successful right shoulder fluoroscopically guided injection.

This exam was performed by Shaquille Mae, NP, and was supervised and
interpreted by Hoag, Jamshid.

ADDENDUM:
On immediate post arthrogram images there is some contrast that
appears to outline supraspinatus tendon insertion and some
musculature about the shoulder. There is definitely contrast within
the joint. This may represent a partial extracapsular injection or
could be related to full-thickness tear, please see the MRI for
further detail.

*** End of Addendum ***
EXAM:
SHOULDER INJECTION FOR MRI

PROCEDURE:
After a thorough discussion of risks and benefits of the procedure,
written and oral informed consent was obtained. The consent
discussion included the risk of bleeding, infection and injury to
nerves and adjacent blood vessels. Extra-articular injection was
also a possible risk discussed.

Preliminary localization was performed over the right shoulder. The
area was marked over superior medial anterior humeral head.

After prep and drape in the usual sterile fashion, the skin and
deeper subcutaneous tissues were anesthetized with 1% Lidocaine
without Epinephrine. Under fluoroscopic guidance, a 22 gauge
inch spinal needle was advanced into the joint over the superior
medial anterior humeral head using an anterior approach.
Intra-articular injection of Lidocaine was performed which flowed
freely and subsequently the joint was distended with 11 ml of
contrast. The MR arthrogram solution was as follows: 15 ml of
Omnipaque 180 contrast agent, 0.05 mL Gadavist, 5 mL saline. The
needle removed, and a sterile dressing applied. The patient was
taken to MRI for subsequent imaging.

The patient tolerated the procedure well and there were no
complications.

FLUOROSCOPY:
Radiation Exposure Index (as provided by the fluoroscopic device):
2.00 mGy Kerma
IMPRESSION: Successful right shoulder fluoroscopically guided injection.

This exam was performed by Shaquille Mae, NP, and was supervised and
interpreted by Hoag, Jamshid.

## 2024-02-07 IMAGING — MR MR SHOULDER*R* W/CM
4 of 5 series · 18 of 40 positions shown · IV contrast (2ml mh)
Comparison: Right shoulder radiograph 03/12/2022.

CLINICAL DATA: Right shoulder pain

EXAM:
MRI OF THE RIGHT SHOULDER WITH CONTRAST
TECHNIQUE: Multiplanar, multisequence MR imaging of the right shoulder was
performed following the administration of intra-articular contrast.
CONTRAST:  See Injection Documentation.

[Series 4: T1 fat-sat · axial · 3.0mm · 0.23mm/px · z∈[-40,+25]mm · 3 of 28 slices shown (1 of 2)]
[im 4/28]
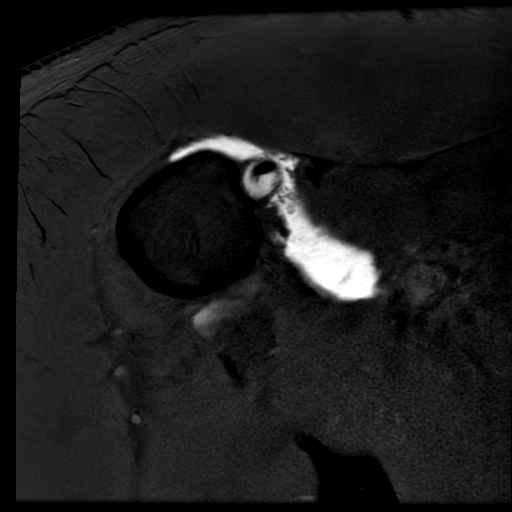
[im 16/28]
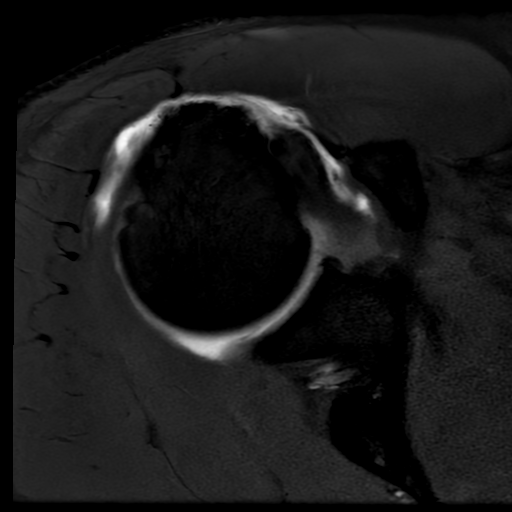
[im 24/28]
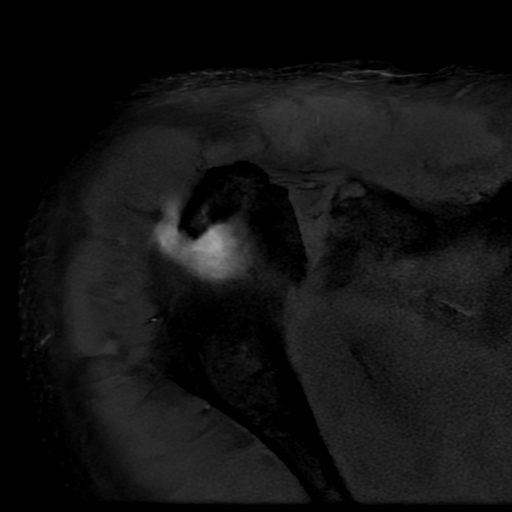

[Series 5: T1 fat-sat · sagittal · 3.0mm · 0.27mm/px · 3 of 24 slices shown (2 of 2)]
[im 4/24]
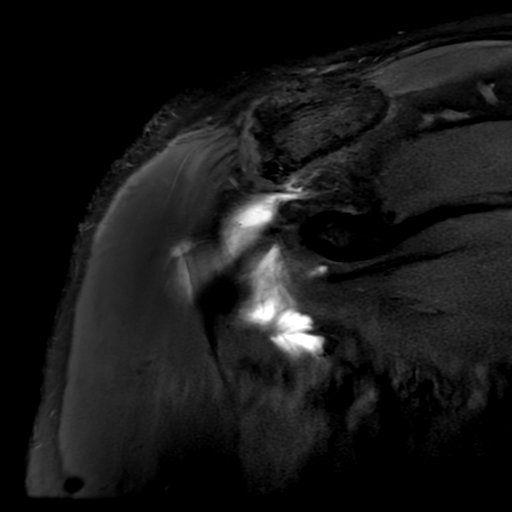
[im 12/24]
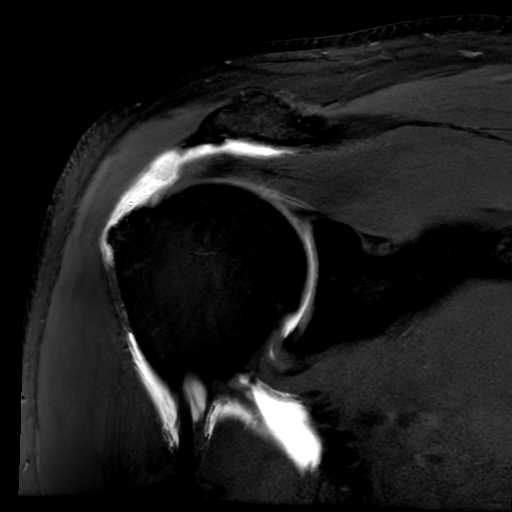
[im 20/24]
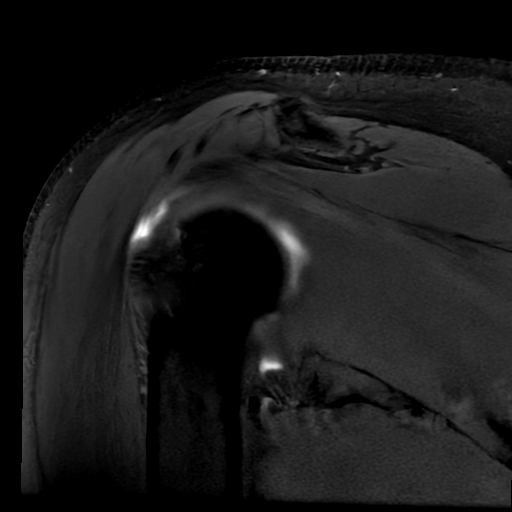

[Series 7: T2 fat-sat · sagittal · 3.0mm · 0.27mm/px · 8 of 24 slices shown (1 of 2)]
[im 1/24]
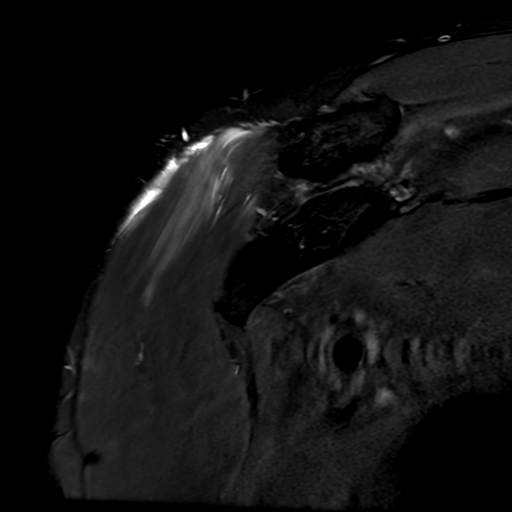
[im 4/24]
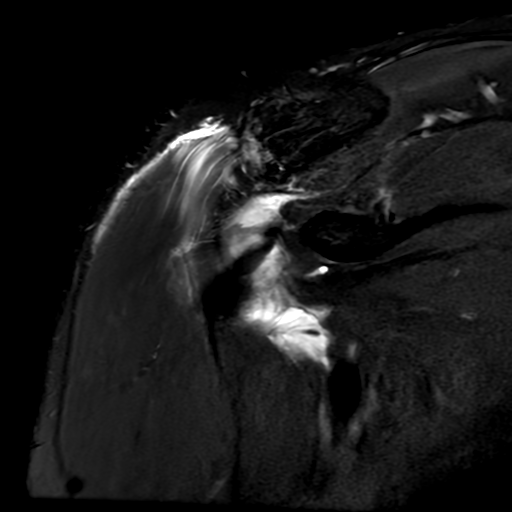
[im 7/24]
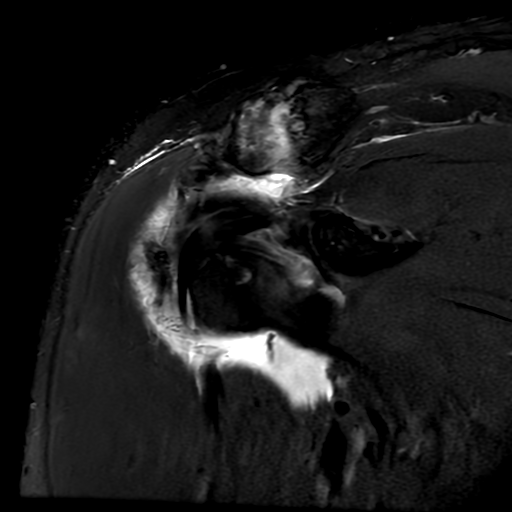
[im 10/24]
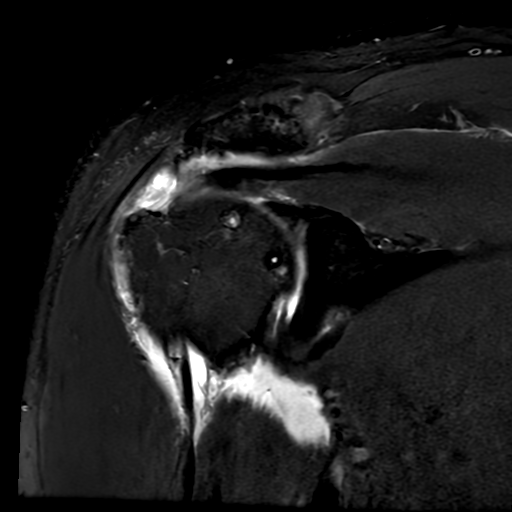
[im 14/24]
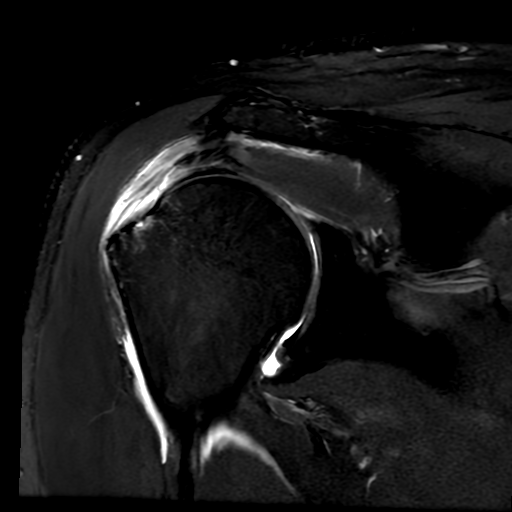
[im 17/24]
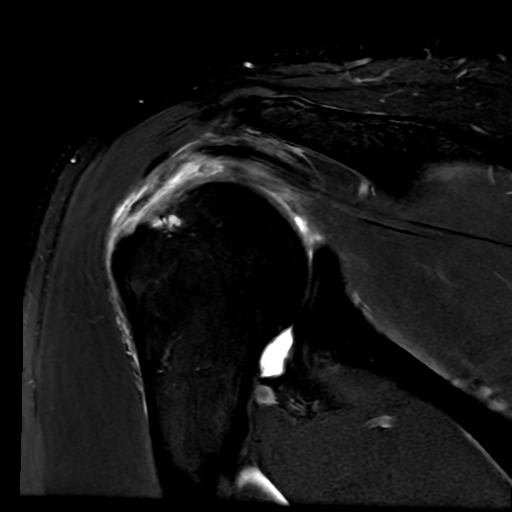
[im 20/24]
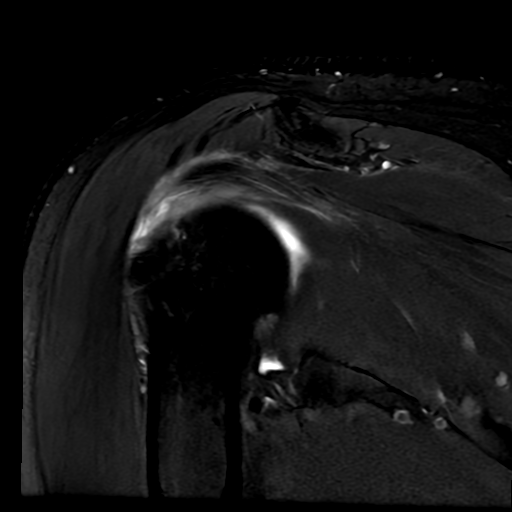
[im 24/24]
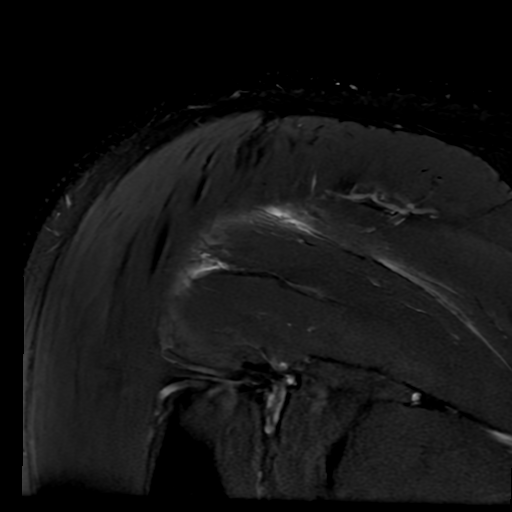

[Series 9: T2 fat-sat · coronal · 3.0mm · 0.27mm/px · 4 of 27 slices shown (2 of 2)]
[im 1/27]
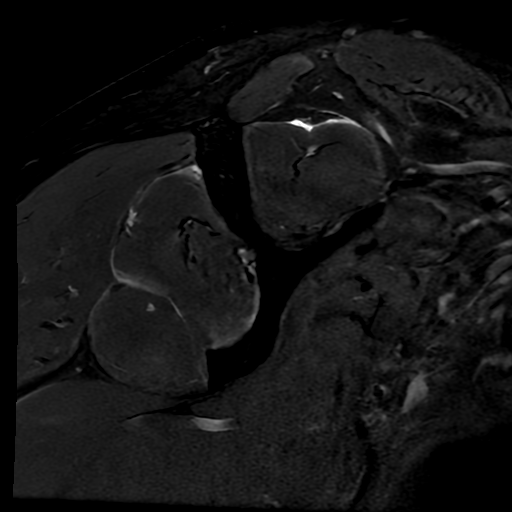
[im 4/27]
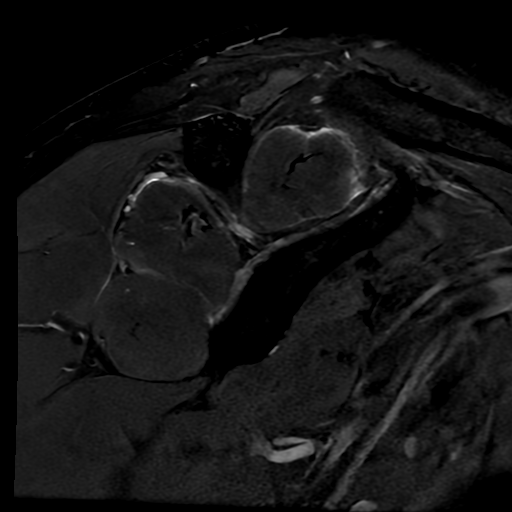
[im 14/27]
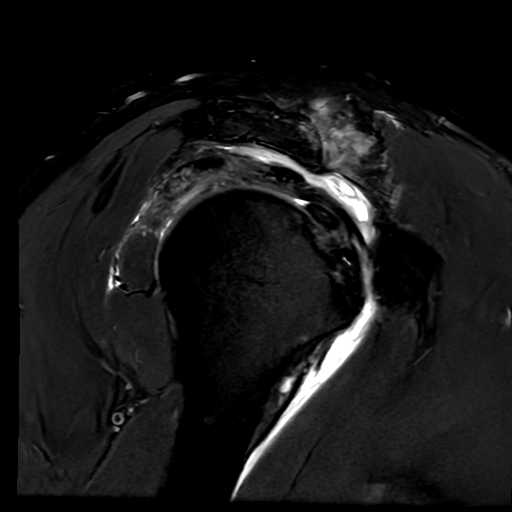
[im 23/27]
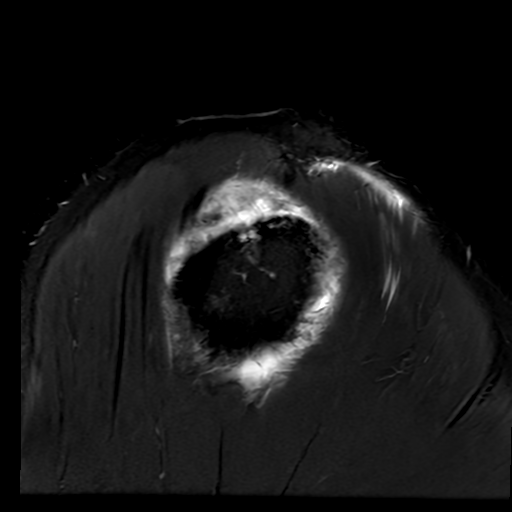

[18 of 40 positions shown; findings below may reference images not displayed]

FINDINGS: Rotator cuff: Suboptimal contrast injection due to needle
positioning. Despite this, there is almost certainly a high-grade
bursal sided tear of the supraspinatus tendon with retraction of
bursal sided fibers, tear measures approximately 2.4 cm
anterior-posterior, degree of retraction 1.5 cm. There is contrast
dissecting through the infraspinatus tendon, which almost certainly
represents high-grade articular sided tearing with full-thickness
perforation. The subscapularis tendon is intact. Teres minor is
intact.

Muscles: No significant muscle atrophy.

Biceps Long Head: There is tendinosis of the intra-articular long
head biceps tendon. Intact extra-articular tendon within the
bicipital groove.

Acromioclavicular Joint: Moderate to severe arthropathy of the
acromioclavicular joint. Small amount of subacromial/subdeltoid
bursal fluid due to injection technique versus full-thickness cuff
perforation.

Glenohumeral Joint: There is adequate joint distension by arthrogram
technique. Mild chondrosis.

Labrum: Degenerative posterosuperior labral tearing.

Bones: No fracture or dislocation. No aggressive osseous lesion.

Other: No fluid collection or hematoma.
IMPRESSION: High-grade bursal sided tear of the supraspinatus tendon with
retraction of bursal sided fibers, tear measures approximately
cm anterior-posterior and degree of retraction 1.5 cm. High-grade
articular sided tear of the infraspinatus tendon with full-thickness
perforation at the footprint. No significant muscle atrophy.

Intra-articular long head biceps tendinosis.

Mild glenohumeral and moderate to severe AC joint arthropathy.

Degenerative posterosuperior labral tearing.

## 2024-02-08 DIAGNOSIS — R319 Hematuria, unspecified: Secondary | ICD-10-CM | POA: Diagnosis not present

## 2024-02-08 DIAGNOSIS — R9349 Abnormal radiologic findings on diagnostic imaging of other urinary organs: Secondary | ICD-10-CM | POA: Diagnosis not present

## 2024-02-08 DIAGNOSIS — R1030 Lower abdominal pain, unspecified: Secondary | ICD-10-CM | POA: Diagnosis not present

## 2024-02-08 DIAGNOSIS — Z960 Presence of urogenital implants: Secondary | ICD-10-CM | POA: Diagnosis not present

## 2024-02-09 DIAGNOSIS — R319 Hematuria, unspecified: Secondary | ICD-10-CM | POA: Diagnosis not present

## 2024-02-10 ENCOUNTER — Telehealth: Payer: Self-pay

## 2024-02-10 NOTE — Transitions of Care (Post Inpatient/ED Visit) (Signed)
   02/10/2024  Name: Cameron Mcneil MRN: [id number] DOB: 04/01/1966  Today's TOC FU Call Status: Today's TOC FU Call Status:: Unsuccessful Call (1st Attempt) Unsuccessful Call (1st Attempt) Date: 02/10/24  Attempted to reach the patient regarding the most recent Inpatient/ED visit. Patient was called in an Outreach attempt to offer [facility]  30-day TOC program. Pt is eligible for program due to potential risk for readmission and/or high utilization. Unfortunately, I was not able to speak with the patient in regards to recent hospital discharge   Left a HIPAA compliant phone message for patient including VBCI CM contact information with request for a call back in regard to recent hospital discharge    Follow Up Plan: Additional outreach attempts will be made to reach the patient to complete the Transitions of Care (Post Inpatient/ED visit) call.   Susa Loffler , BSN, RN [facility]   VBCI-Population Health RN Care Manager Direct Dial [phone]  Fax: [phone] Website: [email or web address]

## 2024-02-11 ENCOUNTER — Telehealth: Payer: Self-pay

## 2024-02-11 NOTE — Transitions of Care (Post Inpatient/ED Visit) (Signed)
   02/11/2024  Name: Cameron Mcneil MRN: [id number] DOB: 1966-08-28  Today's TOC FU Call Status: Today's TOC FU Call Status:: Unsuccessful Call (2nd Attempt) Unsuccessful Call (1st Attempt) Date: 02/10/24 Unsuccessful Call (2nd Attempt) Date: 02/11/24  Attempted to reach the patient regarding the most recent Inpatient/ED visit.  Follow Up Plan: Additional outreach attempts will be made to reach the patient to complete the Transitions of Care (Post Inpatient/ED visit) call.   Deidre Ala, BSN, RN [place]  [facility] - [employer] [facility] RN Care Manager [phone]

## 2024-02-12 ENCOUNTER — Telehealth: Payer: Self-pay | Admitting: *Deleted

## 2024-02-12 NOTE — Transitions of Care (Post Inpatient/ED Visit) (Signed)
   02/12/2024  Name: [name] MRN: [id number] DOB: [birth year]  Today's TOC FU Call Status: Today's TOC FU Call Status:: Unsuccessful Call (3rd Attempt) Unsuccessful Call (3rd Attempt) Date: 02/12/24  Attempted to reach the patient regarding the most recent Inpatient/ED visit.  Follow Up Plan: No further outreach attempts will be made at this time. We have been unable to contact the patient.  Gean Maidens BSN RN [facility] Care Management Coordinator [email or web address] Direct Dial: [phone]  Fax: [phone] Website: [email or web address]

## 2024-02-17 DIAGNOSIS — R310 Gross hematuria: Secondary | ICD-10-CM | POA: Diagnosis not present

## 2024-02-17 DIAGNOSIS — C678 Malignant neoplasm of overlapping sites of bladder: Secondary | ICD-10-CM | POA: Diagnosis not present

## 2024-02-17 DIAGNOSIS — N368 Other specified disorders of urethra: Secondary | ICD-10-CM | POA: Diagnosis not present

## 2024-02-17 DIAGNOSIS — C679 Malignant neoplasm of bladder, unspecified: Secondary | ICD-10-CM | POA: Diagnosis not present

## 2024-02-17 DIAGNOSIS — Z8551 Personal history of malignant neoplasm of bladder: Secondary | ICD-10-CM | POA: Diagnosis not present

## 2024-02-24 ENCOUNTER — Telehealth: Payer: Self-pay

## 2024-02-24 NOTE — Telephone Encounter (Signed)
error 

## 2024-02-25 ENCOUNTER — Other Ambulatory Visit: Payer: Self-pay

## 2024-02-25 DIAGNOSIS — I10 Essential (primary) hypertension: Secondary | ICD-10-CM

## 2024-02-25 MED ORDER — AMLODIPINE BESYLATE 5 MG PO TABS
5.0000 mg | ORAL_TABLET | Freq: Every day | ORAL | 0 refills | Status: DC
Start: 2024-02-25 — End: 2024-06-26

## 2024-03-02 DIAGNOSIS — C678 Malignant neoplasm of overlapping sites of bladder: Secondary | ICD-10-CM | POA: Diagnosis not present

## 2024-03-10 DIAGNOSIS — R350 Frequency of micturition: Secondary | ICD-10-CM | POA: Diagnosis not present

## 2024-03-10 DIAGNOSIS — D490 Neoplasm of unspecified behavior of digestive system: Secondary | ICD-10-CM | POA: Diagnosis not present

## 2024-04-01 DIAGNOSIS — Z833 Family history of diabetes mellitus: Secondary | ICD-10-CM | POA: Diagnosis not present

## 2024-04-01 DIAGNOSIS — Z6835 Body mass index (BMI) 35.0-35.9, adult: Secondary | ICD-10-CM | POA: Diagnosis not present

## 2024-04-01 DIAGNOSIS — Z809 Family history of malignant neoplasm, unspecified: Secondary | ICD-10-CM | POA: Diagnosis not present

## 2024-04-01 DIAGNOSIS — Z8551 Personal history of malignant neoplasm of bladder: Secondary | ICD-10-CM | POA: Diagnosis not present

## 2024-04-01 DIAGNOSIS — I10 Essential (primary) hypertension: Secondary | ICD-10-CM | POA: Diagnosis not present

## 2024-04-01 DIAGNOSIS — Z8249 Family history of ischemic heart disease and other diseases of the circulatory system: Secondary | ICD-10-CM | POA: Diagnosis not present

## 2024-04-03 ENCOUNTER — Ambulatory Visit (INDEPENDENT_AMBULATORY_CARE_PROVIDER_SITE_OTHER): Payer: Self-pay | Admitting: Family Medicine

## 2024-04-03 ENCOUNTER — Encounter: Payer: Self-pay | Admitting: Family Medicine

## 2024-04-03 VITALS — BP 164/92 | HR 67 | Temp 98.0°F | Ht 74.0 in | Wt 274.0 lb

## 2024-04-03 DIAGNOSIS — C679 Malignant neoplasm of bladder, unspecified: Secondary | ICD-10-CM | POA: Diagnosis not present

## 2024-04-03 DIAGNOSIS — E1129 Type 2 diabetes mellitus with other diabetic kidney complication: Secondary | ICD-10-CM

## 2024-04-03 DIAGNOSIS — I10 Essential (primary) hypertension: Secondary | ICD-10-CM | POA: Diagnosis not present

## 2024-04-03 DIAGNOSIS — E119 Type 2 diabetes mellitus without complications: Secondary | ICD-10-CM

## 2024-04-03 DIAGNOSIS — W57XXXA Bitten or stung by nonvenomous insect and other nonvenomous arthropods, initial encounter: Secondary | ICD-10-CM | POA: Diagnosis not present

## 2024-04-03 DIAGNOSIS — R809 Proteinuria, unspecified: Secondary | ICD-10-CM

## 2024-04-03 LAB — BASIC METABOLIC PANEL WITH GFR
BUN: 14 mg/dL (ref 6–23)
CO2: 28 meq/L (ref 19–32)
Calcium: 9.4 mg/dL (ref 8.4–10.5)
Chloride: 101 meq/L (ref 96–112)
Creatinine, Ser: 1.17 mg/dL (ref 0.40–1.50)
GFR: 70.07 mL/min (ref >60.00)
Glucose, Bld: 146 mg/dL — ABNORMAL HIGH (ref 70–99)
Potassium: 4 meq/L (ref 3.5–5.1)
Sodium: 137 meq/L (ref 135–145)

## 2024-04-03 LAB — HEMOGLOBIN A1C: Hgb A1c MFr Bld: 7.1 % — ABNORMAL HIGH (ref 4.6–6.5)

## 2024-04-03 MED ORDER — LOSARTAN POTASSIUM 100 MG PO TABS: 100.0000 mg | ORAL_TABLET | Freq: Every day | ORAL | Status: DC

## 2024-04-03 MED ORDER — DOXAZOSIN MESYLATE 1 MG PO TABS: 1.0000 mg | ORAL_TABLET | Freq: Every day | ORAL | 1 refills | Status: AC

## 2024-04-03 NOTE — Progress Notes (Unsigned)
 Outside notes reviewed with patient.   =================== He underwent eventual cystoscopy on 08/08/23 which found multiple bladder tumors. He was brought to the OR on 09/24/23 for TURBT. Tumor was apparently also found in the prostatic urethral so a TURP was also done at that time. Tissue returned showing high-grade urothelial carcinoma with invasion into the lamina propria. Muscle tissue was negative. He returned for a second-look TURBT on 10/22/23 with pathology showing much of the same. He continued to have intermittent hematuria thereafter.   He denies any weight loss, early satiety, bone pain, or signs of retention. He admits to hematuria as above. At the same time, he has been worked up for a tumor on his parotid gland. Biopsy done on 09/16/23 noted this to be a benign tumor.  ===================  He is considering options re: cancer treatment.  He is considering going to what sounds to be a medical resort in [place] that he said would eliminate the aluminum from his body as a form of medical treatment.  Discussed.  Advised the patient that unless this resort has published data that has been independently verified demonstrating reproducible cancer outcomes for his type of cancer that are better than the treatment available through the [facility] clinic, then it would be AGAINST MEDICAL ADVICE to pursue the treatment in [place].   Recheck A1c pending.  No DM meds.    Hypertension:    Using medication without problems or lightheadedness: yes Chest pain with exertion:no Edema: some BLE edema with 5mg  amlodipine .  Short of breath:no Already taking 100mg  losartan  and 5mg  amlodipine .   Local irritation R hip area after tick bite yesterday.  He removed it.    Meds, vitals, and allergies reviewed.   PMH and SH reviewed  ROS: Per HPI unless specifically indicated in ROS section   GEN: nad, alert and oriented HEENT: mucous membranes moist NECK: supple w/o LA CV: rrr. PULM: ctab, no inc wob ABD:  soft, +bs EXT: no edema SKIN: no acute rash but local irritation near the right hip area at the tick bite site.  No spreading erythema otherwise.

## 2024-04-03 NOTE — Patient Instructions (Addendum)
 BMET and A1c on the way out.  Take care.  Glad to see you. I would try doxazosin 1mg  a day.  Update me about your BP in about 10 days.  We may need to change your dose.

## 2024-04-05 ENCOUNTER — Encounter: Payer: Self-pay | Admitting: Family Medicine

## 2024-04-05 DIAGNOSIS — W57XXXA Bitten or stung by nonvenomous insect and other nonvenomous arthropods, initial encounter: Secondary | ICD-10-CM | POA: Insufficient documentation

## 2024-04-05 DIAGNOSIS — C679 Malignant neoplasm of bladder, unspecified: Secondary | ICD-10-CM | POA: Insufficient documentation

## 2024-04-05 NOTE — Assessment & Plan Note (Signed)
 Observation for now.  Routine cautions given to patient.  If fever or other systemic symptoms or progressive rash then let me know.

## 2024-04-05 NOTE — Assessment & Plan Note (Signed)
 Already taking 100mg  losartan  and 5mg  amlodipine .  He has some BLE edema with 5mg  amlodipine .  Add on doxazosin 1 mg.  Rationale for use discussed with patient.  He can update me about his blood pressure and we can go from there.  We may need to adjust his dose.

## 2024-04-05 NOTE — Assessment & Plan Note (Signed)
 He is considering options re: cancer treatment.  He is considering going to what sounds to be a medical resort in [place] that he said would eliminate the aluminum from his body as a form of medical treatment.  Discussed.  Advised the patient that unless this resort has published data that has been independently verified demonstrating reproducible cancer outcomes for his type of cancer that are better than the treatment available through the [facility] clinic, then it would be AGAINST MEDICAL ADVICE to pursue the treatment in [place].  I advised him to follow-up with the [facility] clinic.

## 2024-04-05 NOTE — Assessment & Plan Note (Signed)
 Recheck A1c pending.  No DM meds.  See notes on labs.

## 2024-04-13 DIAGNOSIS — C689 Malignant neoplasm of urinary organ, unspecified: Secondary | ICD-10-CM | POA: Diagnosis not present

## 2024-05-22 ENCOUNTER — Other Ambulatory Visit: Payer: Self-pay | Admitting: Family Medicine

## 2024-05-26 DIAGNOSIS — C689 Malignant neoplasm of urinary organ, unspecified: Secondary | ICD-10-CM | POA: Diagnosis not present

## 2024-05-26 DIAGNOSIS — R8289 Other abnormal findings on cytological and histological examination of urine: Secondary | ICD-10-CM | POA: Diagnosis not present

## 2024-05-29 DIAGNOSIS — R319 Hematuria, unspecified: Secondary | ICD-10-CM | POA: Diagnosis not present

## 2024-05-29 DIAGNOSIS — R300 Dysuria: Secondary | ICD-10-CM | POA: Diagnosis not present

## 2024-05-29 DIAGNOSIS — R350 Frequency of micturition: Secondary | ICD-10-CM | POA: Diagnosis not present

## 2024-06-16 DIAGNOSIS — C679 Malignant neoplasm of bladder, unspecified: Secondary | ICD-10-CM | POA: Diagnosis not present

## 2024-06-16 DIAGNOSIS — C689 Malignant neoplasm of urinary organ, unspecified: Secondary | ICD-10-CM | POA: Diagnosis not present

## 2024-06-16 DIAGNOSIS — N400 Enlarged prostate without lower urinary tract symptoms: Secondary | ICD-10-CM | POA: Diagnosis not present

## 2024-06-25 ENCOUNTER — Other Ambulatory Visit: Payer: Self-pay | Admitting: Family Medicine

## 2024-06-25 DIAGNOSIS — I10 Essential (primary) hypertension: Secondary | ICD-10-CM

## 2024-06-30 DIAGNOSIS — C679 Malignant neoplasm of bladder, unspecified: Secondary | ICD-10-CM | POA: Diagnosis not present

## 2024-07-14 ENCOUNTER — Ambulatory Visit: Admitting: Family Medicine

## 2024-07-17 DIAGNOSIS — R8289 Other abnormal findings on cytological and histological examination of urine: Secondary | ICD-10-CM | POA: Diagnosis not present

## 2024-07-17 DIAGNOSIS — C678 Malignant neoplasm of overlapping sites of bladder: Secondary | ICD-10-CM | POA: Diagnosis not present

## 2024-07-17 DIAGNOSIS — C689 Malignant neoplasm of urinary organ, unspecified: Secondary | ICD-10-CM | POA: Diagnosis not present

## 2024-07-17 DIAGNOSIS — C679 Malignant neoplasm of bladder, unspecified: Secondary | ICD-10-CM | POA: Diagnosis not present

## 2024-07-17 DIAGNOSIS — C680 Malignant neoplasm of urethra: Secondary | ICD-10-CM | POA: Diagnosis not present

## 2024-07-19 ENCOUNTER — Other Ambulatory Visit: Payer: Self-pay | Admitting: Family Medicine

## 2024-07-21 DIAGNOSIS — Z466 Encounter for fitting and adjustment of urinary device: Secondary | ICD-10-CM | POA: Diagnosis not present

## 2024-07-21 NOTE — Progress Notes (Signed)
 [facility] Foley Catheter Removal  18 Fr latex foley catheter removed per order.  Balloon deflated with 10 ml.  Patient tolerated without difficulty.    Patient sent to clinic lobby and is to return post void or if unable to void for >2 hours.  Patient returned to clinic and voided 600.    Post void residual obtained via bladder scanner and resulted in 62 ml.  Provider notified of results.  Patient sent home without foley.  Reviewed signs/symptoms of urinary retention and instructed to go to the nearest ED should this occur.  Patient verbalized an understanding.

## 2024-07-30 DIAGNOSIS — R309 Painful micturition, unspecified: Secondary | ICD-10-CM | POA: Diagnosis not present

## 2024-07-30 DIAGNOSIS — C689 Malignant neoplasm of urinary organ, unspecified: Secondary | ICD-10-CM | POA: Diagnosis not present

## 2024-07-30 DIAGNOSIS — D464 Refractory anemia, unspecified: Secondary | ICD-10-CM | POA: Diagnosis not present

## 2024-07-31 ENCOUNTER — Telehealth: Payer: Self-pay | Admitting: Family Medicine

## 2024-07-31 NOTE — Telephone Encounter (Signed)
 I don't see what order is needed and I am just seeing this now.  Please see if you can get details on the order and I'll see about working on it.  Thanks.

## 2024-07-31 NOTE — Telephone Encounter (Signed)
 Copied from CRM [id number]. Topic: Clinical - Request for Lab/Test Order >> Jul 31, 2024 12:58 PM Aisha D wrote: Reason for CRM: Pt stated that his oncologist ordered labs to have a urine sample completed at the office and is wanting to schedule an appt to come in today. I informed the pt that the order is currently not showing in the chart. Pt would like to have Dr.Duncan put in the urine order if possible so he can come in today to complete the labs.    ----------------------------------------------------------------------- From previous Reason for Contact - Referral Question: Reason for CRM:

## 2024-08-03 NOTE — Telephone Encounter (Signed)
 Left voicemail for patient to return call to office.

## 2024-08-13 DIAGNOSIS — I10 Essential (primary) hypertension: Secondary | ICD-10-CM | POA: Diagnosis not present

## 2024-08-13 DIAGNOSIS — Z01818 Encounter for other preprocedural examination: Secondary | ICD-10-CM | POA: Diagnosis not present

## 2024-08-13 DIAGNOSIS — C689 Malignant neoplasm of urinary organ, unspecified: Secondary | ICD-10-CM | POA: Diagnosis not present

## 2024-08-13 DIAGNOSIS — E119 Type 2 diabetes mellitus without complications: Secondary | ICD-10-CM | POA: Diagnosis not present

## 2024-08-13 DIAGNOSIS — E669 Obesity, unspecified: Secondary | ICD-10-CM | POA: Diagnosis not present

## 2024-08-21 ENCOUNTER — Other Ambulatory Visit: Payer: Self-pay | Admitting: Family Medicine

## 2024-08-21 DIAGNOSIS — I10 Essential (primary) hypertension: Secondary | ICD-10-CM

## 2024-08-31 DIAGNOSIS — C678 Malignant neoplasm of overlapping sites of bladder: Secondary | ICD-10-CM | POA: Diagnosis not present

## 2024-08-31 DIAGNOSIS — C679 Malignant neoplasm of bladder, unspecified: Secondary | ICD-10-CM | POA: Diagnosis not present

## 2024-08-31 DIAGNOSIS — C689 Malignant neoplasm of urinary organ, unspecified: Secondary | ICD-10-CM | POA: Diagnosis not present

## 2024-08-31 DIAGNOSIS — N401 Enlarged prostate with lower urinary tract symptoms: Secondary | ICD-10-CM | POA: Diagnosis not present

## 2024-09-15 DIAGNOSIS — C672 Malignant neoplasm of lateral wall of bladder: Secondary | ICD-10-CM | POA: Diagnosis not present

## 2024-09-15 DIAGNOSIS — R309 Painful micturition, unspecified: Secondary | ICD-10-CM | POA: Diagnosis not present

## 2024-12-28 NOTE — Progress Notes (Signed)
 REASON HELZER                                          MRN: [id number]   12/28/2024   The [facility] Quality Team Specialist reviewed this patient medical record for the purposes of chart review for care gap closure. The following were reviewed: chart review for care gap closure-controlling blood pressure.    [facility] Quality Team
